# Patient Record
Sex: Female | Born: 1985 | Race: Black or African American | Hispanic: No | Marital: Single | Smoking: Current some day smoker
Health system: Southern US, Community
[De-identification: ages and names within clinical notes are randomized; demographics above are authoritative.]

## PROBLEM LIST (undated history)

## (undated) DIAGNOSIS — D573 Sickle-cell trait: Secondary | ICD-10-CM

## (undated) DIAGNOSIS — D696 Thrombocytopenia, unspecified: Secondary | ICD-10-CM

## (undated) DIAGNOSIS — D569 Thalassemia, unspecified: Secondary | ICD-10-CM

## (undated) DIAGNOSIS — N390 Urinary tract infection, site not specified: Secondary | ICD-10-CM

## (undated) HISTORY — PX: INDUCED ABORTION: SHX677

## (undated) HISTORY — DX: Thrombocytopenia, unspecified: D69.6

---
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
## 2004-06-07 ENCOUNTER — Emergency Department (HOSPITAL_COMMUNITY): Admission: EM | Admit: 2004-06-07 | Discharge: 2004-06-07 | Payer: Self-pay | Admitting: Emergency Medicine

## 2005-03-01 ENCOUNTER — Emergency Department (HOSPITAL_COMMUNITY): Admission: EM | Admit: 2005-03-01 | Discharge: 2005-03-01 | Payer: Self-pay | Admitting: Family Medicine

## 2006-06-04 ENCOUNTER — Emergency Department (HOSPITAL_COMMUNITY): Admission: EM | Admit: 2006-06-04 | Discharge: 2006-06-04 | Payer: Self-pay | Admitting: Emergency Medicine

## 2006-12-10 ENCOUNTER — Emergency Department (HOSPITAL_COMMUNITY): Admission: EM | Admit: 2006-12-10 | Discharge: 2006-12-10 | Payer: Self-pay | Admitting: Emergency Medicine

## 2008-02-25 ENCOUNTER — Emergency Department (HOSPITAL_COMMUNITY): Admission: EM | Admit: 2008-02-25 | Discharge: 2008-02-26 | Payer: Self-pay | Admitting: Emergency Medicine

## 2008-02-27 ENCOUNTER — Emergency Department (HOSPITAL_COMMUNITY): Admission: EM | Admit: 2008-02-27 | Discharge: 2008-02-28 | Payer: Self-pay | Admitting: Emergency Medicine

## 2008-02-28 ENCOUNTER — Inpatient Hospital Stay (HOSPITAL_COMMUNITY): Admission: AD | Admit: 2008-02-28 | Discharge: 2008-02-28 | Payer: Self-pay | Admitting: Obstetrics & Gynecology

## 2008-03-02 ENCOUNTER — Inpatient Hospital Stay (HOSPITAL_COMMUNITY): Admission: AD | Admit: 2008-03-02 | Discharge: 2008-03-02 | Payer: Self-pay | Admitting: Obstetrics & Gynecology

## 2008-05-01 ENCOUNTER — Encounter: Payer: Self-pay | Admitting: Family Medicine

## 2008-05-01 ENCOUNTER — Ambulatory Visit: Payer: Self-pay | Admitting: Family Medicine

## 2008-05-01 LAB — CONVERTED CEMR LAB
Antibody Screen: NEGATIVE
Basophils Absolute: 0 K/uL (ref 0.0–0.1)
Basophils Relative: 0 % (ref 0–1)
Eosinophils Absolute: 0.1 K/uL (ref 0.0–0.7)
Eosinophils Relative: 1 % (ref 0–5)
HCT: 27.7 % — ABNORMAL LOW (ref 36.0–46.0)
Hemoglobin: 10 g/dL — ABNORMAL LOW (ref 12.0–15.0)
Hepatitis B Surface Ag: NEGATIVE
Lymphocytes Relative: 27 % (ref 12–46)
Lymphs Abs: 2.4 K/uL (ref 0.7–4.0)
MCHC: 36.1 g/dL — ABNORMAL HIGH (ref 30.0–36.0)
MCV: 65.6 fL — ABNORMAL LOW (ref 78.0–100.0)
Monocytes Absolute: 0.8 K/uL (ref 0.1–1.0)
Monocytes Relative: 10 % (ref 3–12)
Neutro Abs: 5.3 K/uL (ref 1.7–7.7)
Neutrophils Relative %: 62 % (ref 43–77)
Platelets: 131 K/uL — ABNORMAL LOW (ref 150–400)
RBC: 4.22 M/uL (ref 3.87–5.11)
RDW: 19 % — ABNORMAL HIGH (ref 11.5–15.5)
Rh Type: POSITIVE
Rubella: 25.5 [IU]/mL — ABNORMAL HIGH
Sickle Cell Screen: NEGATIVE
WBC: 8.6 10*3/microliter (ref 4.0–10.5)

## 2008-05-04 ENCOUNTER — Encounter: Payer: Self-pay | Admitting: Family Medicine

## 2008-05-04 ENCOUNTER — Ambulatory Visit: Payer: Self-pay | Admitting: Family Medicine

## 2008-05-04 ENCOUNTER — Other Ambulatory Visit: Admission: RE | Admit: 2008-05-04 | Discharge: 2008-05-04 | Payer: Self-pay | Admitting: Family Medicine

## 2008-05-04 DIAGNOSIS — E739 Lactose intolerance, unspecified: Secondary | ICD-10-CM

## 2008-05-04 LAB — CONVERTED CEMR LAB: GC Probe Amp, Genital: NEGATIVE

## 2008-05-06 ENCOUNTER — Encounter: Payer: Self-pay | Admitting: Family Medicine

## 2008-05-06 ENCOUNTER — Ambulatory Visit (HOSPITAL_COMMUNITY): Admission: RE | Admit: 2008-05-06 | Discharge: 2008-05-06 | Payer: Self-pay | Admitting: Family Medicine

## 2008-05-07 ENCOUNTER — Ambulatory Visit: Payer: Self-pay | Admitting: Family Medicine

## 2008-05-07 ENCOUNTER — Encounter: Payer: Self-pay | Admitting: Family Medicine

## 2008-05-08 ENCOUNTER — Encounter: Payer: Self-pay | Admitting: Family Medicine

## 2008-05-13 LAB — CONVERTED CEMR LAB
Pap Smear: NORMAL
Pap Smear: NORMAL

## 2008-05-19 ENCOUNTER — Ambulatory Visit: Payer: Self-pay | Admitting: Family Medicine

## 2008-05-19 ENCOUNTER — Encounter: Payer: Self-pay | Admitting: Family Medicine

## 2008-05-19 LAB — CONVERTED CEMR LAB
HCT: 26.4 % — ABNORMAL LOW (ref 36.0–46.0)
Hemoglobin: 9.7 g/dL — ABNORMAL LOW (ref 12.0–15.0)
Hgb A2 Quant: 4.1 % — ABNORMAL HIGH (ref 2.2–3.2)
Hgb A: 19.8 % — ABNORMAL LOW (ref 96.8–97.8)
Hgb F Quant: 15.8 % — ABNORMAL HIGH (ref 0.0–2.0)
Hgb S Quant: 0 % (ref 0.0–0.0)
Platelets: 113 10*3/uL — ABNORMAL LOW (ref 150–400)
RDW: 19.4 % — ABNORMAL HIGH (ref 11.5–15.5)

## 2008-05-22 ENCOUNTER — Telehealth: Payer: Self-pay | Admitting: Family Medicine

## 2008-06-05 ENCOUNTER — Encounter: Payer: Self-pay | Admitting: Family Medicine

## 2008-06-05 ENCOUNTER — Ambulatory Visit: Payer: Self-pay | Admitting: Family Medicine

## 2008-06-05 DIAGNOSIS — G5600 Carpal tunnel syndrome, unspecified upper limb: Secondary | ICD-10-CM

## 2008-06-05 DIAGNOSIS — O99019 Anemia complicating pregnancy, unspecified trimester: Secondary | ICD-10-CM

## 2008-06-17 ENCOUNTER — Ambulatory Visit: Payer: Self-pay | Admitting: Family Medicine

## 2008-06-29 ENCOUNTER — Encounter: Payer: Self-pay | Admitting: Family Medicine

## 2008-06-29 ENCOUNTER — Ambulatory Visit: Payer: Self-pay | Admitting: Family Medicine

## 2008-07-08 ENCOUNTER — Encounter: Payer: Self-pay | Admitting: Family Medicine

## 2008-07-08 ENCOUNTER — Ambulatory Visit: Payer: Self-pay | Admitting: Family Medicine

## 2008-07-14 ENCOUNTER — Inpatient Hospital Stay (HOSPITAL_COMMUNITY): Admission: AD | Admit: 2008-07-14 | Discharge: 2008-07-14 | Payer: Self-pay | Admitting: Obstetrics & Gynecology

## 2008-07-15 ENCOUNTER — Ambulatory Visit: Payer: Self-pay | Admitting: Family Medicine

## 2008-07-15 ENCOUNTER — Inpatient Hospital Stay (HOSPITAL_COMMUNITY): Admission: AD | Admit: 2008-07-15 | Discharge: 2008-07-17 | Payer: Self-pay | Admitting: Obstetrics & Gynecology

## 2008-07-20 ENCOUNTER — Telehealth: Payer: Self-pay | Admitting: Family Medicine

## 2008-07-21 ENCOUNTER — Ambulatory Visit: Payer: Self-pay | Admitting: Family Medicine

## 2008-07-21 ENCOUNTER — Encounter: Payer: Self-pay | Admitting: Sports Medicine

## 2008-07-21 LAB — CONVERTED CEMR LAB
Basophils Absolute: 0 10*3/uL (ref 0.0–0.1)
Basophils Relative: 0 % (ref 0–1)
Eosinophils Absolute: 0.2 10*3/uL (ref 0.0–0.7)
Eosinophils Relative: 2 % (ref 0–5)
HCT: 32.5 % — ABNORMAL LOW (ref 36.0–46.0)
Hemoglobin: 10.4 g/dL — ABNORMAL LOW (ref 12.0–15.0)
Lymphocytes Relative: 30 % (ref 12–46)
Lymphs Abs: 2.3 K/uL (ref 0.7–4.0)
MCHC: 32 g/dL (ref 30.0–36.0)
MCV: 75.6 fL — ABNORMAL LOW (ref 78.0–100.0)
Monocytes Absolute: 0.7 10*3/uL (ref 0.1–1.0)
Monocytes Relative: 9 % (ref 3–12)
Neutro Abs: 4.5 10*3/uL (ref 1.7–7.7)
Neutrophils Relative %: 59 % (ref 43–77)
Platelets: 190 K/uL (ref 150–400)
RBC: 4.3 M/uL (ref 3.87–5.11)
RDW: 19.6 % — ABNORMAL HIGH (ref 11.5–15.5)
WBC: 7.6 10*3/microliter (ref 4.0–10.5)

## 2009-01-25 ENCOUNTER — Emergency Department (HOSPITAL_COMMUNITY): Admission: EM | Admit: 2009-01-25 | Discharge: 2009-01-26 | Payer: Self-pay | Admitting: Emergency Medicine

## 2009-02-15 ENCOUNTER — Emergency Department (HOSPITAL_COMMUNITY): Admission: EM | Admit: 2009-02-15 | Discharge: 2009-02-15 | Payer: Self-pay | Admitting: Emergency Medicine

## 2009-06-14 ENCOUNTER — Encounter: Payer: Self-pay | Admitting: Family Medicine

## 2010-02-01 NOTE — Progress Notes (Signed)
Summary: triage  Phone Note Call from Patient Call back at [phone]   Caller: Patient Summary of Call: pt had baby last week had epidural and is still having real bad back pain. Initial call taken by: Clydell Hakim,  July 20, 2008 3:00 PM  Follow-up for Phone Call        crying. states the pain comes in "spurts"  states she is crying for reasons other than her back. taking ibuprofen that does help a little. did not want to go to [facility] or Urgent care. appt in am 8:30  (had a baby boy)  told her to go if the pain got worse Follow-up by: Golden Circle RN,  July 20, 2008 3:27 PM

## 2010-02-01 NOTE — Miscellaneous (Signed)
  Clinical Lists Changes  Problems: Removed problem of NECK AND BACK PAIN (ICD-723.1) Removed problem of PRENATAL CARE, DELAYED (ICD-V23.7) Removed problem of SUPERVISION OF OTHER NORMAL PREGNANCY (ICD-V22.1) Medications: Removed medication of PERCOCET 5-325 MG TABS (OXYCODONE-ACETAMINOPHEN) One to Two tabs by mouth q4-6h as needed for back pain Removed medication of PRENATAL VITAMINS 0.8 MG TABS (PRENATAL MULTIVIT-MIN-FE-FA) one by mouth daily Removed medication of FE TABS 325 (65 FE) MG TBEC (FERROUS SULFATE) one by mouth two times a day Removed medication of COLACE 100 MG CAPS (DOCUSATE SODIUM) one by mouth two times a day as needed for constipation Observations: Added new observation of PAST MED HX: sickle cell trait per 2005 delivery records NSVD x 2 pap 5/10 normal (06/14/2009 12:15) Added new observation of PRIMARY MD: Myrtie Soman  MD (06/14/2009 12:15)       Past History:  Past Medical History: sickle cell trait per 2005 delivery records NSVD x 2 pap 5/10 normal

## 2010-02-01 NOTE — Assessment & Plan Note (Signed)
 Summary: PAIN FROM EPIDURAL/KH   Vital Signs:  Patient profile:   25 year old female Weight:      144 pounds Temp:     98.7 degrees F Pulse rate:   75 / minute BP sitting:   142 / 89  (left arm)  Vitals Entered By: Jack Bloodgood CMA, (July 21, 2008 9:22 AM) CC: pain.SABRAafter epidural from neck to lower back Is Patient Diabetic? No Pain Assessment Patient in pain? yes     Location: neck/back Intensity: 4 Onset of pain  x 1week   Primary Care Provider:  Jerel Prescott  MD  CC:  pain.SABRAafter epidural from neck to lower back.  History of Present Illness: 5F 6 days postpartum.  Had epidural anesthesia.  Now with c/o neck and back pain since DC.  Had lots of LBP during gestation.  Now with pain that is described as spasm present from mid cervical region down to sacrum.  No HA, no numbness/tingling/weakness in extremeties, no loss of bowel or bladder, ambulatory, No fevers/chills/N/V/D/C.  Pain does not change with position but does worsen with movement.    Otherwise doing well post-partum.  Habits & Providers  Alcohol-Tobacco-Diet     Tobacco Status: quit  Past History:  Past Medical History: Last updated: 05/19/2008 sickle cell trait per 2005 delivery records NSVD x 1 pap 5/10 normal  Past Surgical History: Last updated: 05/04/2008 none  Family History: Last updated: 05/04/2008 diabetes in mom and diabetes HTN in mom and maternal grandmother  Social History: Last updated: 05/04/2008 Lives with 10 year-old son. Works as LAWYER at [employer]. Non-smoker, no alcohol, no drug use.   Social History: Smoking Status:  quit  Review of Systems       See HPI  Physical Exam  General:  Well-developed,well-nourished,in no acute distress; alert,appropriate and cooperative throughout examination Head:  Normocephalic and atraumatic without obvious abnormalities. No apparent alopecia or balding. Eyes:  No corneal or conjunctival inflammation noted. EOMI. Perrla.    Ears:  External ear exam shows no significant lesions or deformities.  Nose:  External nasal examination shows no deformity or inflammation Mouth:  Oral mucosa and oropharynx without lesions or exudates. Neck:  No deformities, masses noted. Lungs:  Normal respiratory effort, chest expands symmetrically. Lungs are clear to auscultation, no crackles or wheezes. Heart:  Normal rate and regular rhythm. S1 and S2 normal without gallop, murmur, click, rub or other extra sounds. Abdomen:  Bowel sounds positive,abdomen soft and non-tender without masses, organomegaly or hernias noted. Msk:  TTP paraspinal from mid cervical down to mid sacral.  Paraspinal muscles both firm.  TTP over SI joints.  Pain reproduced with flexion of hips and flattening of lumbar lordosis.  FABER and FADIR tests negative.  straight leg raise does not produce sciatic-type paresthesias, only LBP.  No swelling, erythema, drainage from well healed spinal anesthesia site.   Extremities:  No clubbing, cyanosis, edema, or deformity noted with normal full range of motion of all joints.  Ambulatory with normal gait. Neurologic:  Strength 5/5 in bilateral upper and lower ext.  Sensation intact.  DTRs 2+ in achilles and patellar ligaments.  No ankle clonus.  Trendelenburg sign negative.   Impression & Recommendations:  Problem # 1:  NECK AND BACK PAIN (ICD-723.1) Assessment New Post-spinal anesthesia back pain can last up to 14 days after LP and is not associated with positional changes like post-LP headaches are.  Things to rule out are epidural CSF leak, hematoma, and infection.  No fevers/chills/swelling  in the area, unlikely infection. Will check CBC with diff to make sure.  If no improvement by day 14 then will consider imaging to r/o CSF leak or hematoma.  Percocet for pain for now.  RTC if ay warning symptoms, increasing pain, numbness/tingling, bowel/bladder incontinence, fevers/chills.  RTC in 8 days to reassess symptoms.  Her  updated medication list for this problem includes:    Percocet 5-325 Mg Tabs (Oxycodone -acetaminophen ) ..... One to two tabs by mouth q4-6h as needed for back pain  Orders: CBC w/Diff-[facility] (14974) [facility]- Est Level  3 (00786)  Complete Medication List: 1)  Prenatal Vitamins 0.8 Mg Tabs (Prenatal multivit-min-fe-fa) .... One by mouth daily 2)  Fe Tabs 325 (65 Fe) Mg Tbec (Ferrous sulfate ) .... One by mouth two times a day 3)  Colace 100 Mg Caps (Docusate sodium ) .... One by mouth two times a day as needed for constipation 4)  Percocet 5-325 Mg Tabs (Oxycodone -acetaminophen ) .... One to two tabs by mouth q4-6h as needed for back pain  Patient Instructions: 1)  Good to meet you today, 2)  I want you to go to the lab and have your blood checked, then take the percocet for the back pain.  Come back to see me in 8 days to reassess your pain. 3)  If you start to have numbness, tingling, weakness, fevers/chills, bowel/bladder incontinence, swelling/drainage from your lower back then come back to the office immediately. 4)  -Dr. ONEIDA. Prescriptions: PERCOCET 5-325 MG TABS (OXYCODONE -ACETAMINOPHEN ) One to Two tabs by mouth q4-6h as needed for back pain  #30 x 0   Entered and Authorized by:   Debby Petties MD   Signed by:   Debby Petties MD on 07/21/2008   Method used:   Handwritten   RxID:   [id number]

## 2010-03-29 ENCOUNTER — Emergency Department (HOSPITAL_COMMUNITY)
Admission: EM | Admit: 2010-03-29 | Discharge: 2010-03-29 | Disposition: A | Discharge status: Home / Self Care | Payer: Self-pay | Attending: Emergency Medicine | Admitting: Emergency Medicine

## 2010-03-29 ENCOUNTER — Emergency Department (HOSPITAL_COMMUNITY): Payer: Self-pay

## 2010-03-29 DIAGNOSIS — M940 Chondrocostal junction syndrome [Tietze]: Secondary | ICD-10-CM | POA: Insufficient documentation

## 2010-03-29 DIAGNOSIS — R071 Chest pain on breathing: Secondary | ICD-10-CM | POA: Insufficient documentation

## 2010-03-29 DIAGNOSIS — R059 Cough, unspecified: Secondary | ICD-10-CM | POA: Insufficient documentation

## 2010-03-29 DIAGNOSIS — R6883 Chills (without fever): Secondary | ICD-10-CM | POA: Insufficient documentation

## 2010-03-29 DIAGNOSIS — R05 Cough: Secondary | ICD-10-CM | POA: Insufficient documentation

## 2010-03-29 DIAGNOSIS — J069 Acute upper respiratory infection, unspecified: Secondary | ICD-10-CM | POA: Insufficient documentation

## 2010-03-29 LAB — URINALYSIS, ROUTINE W REFLEX MICROSCOPIC
Glucose, UA: NEGATIVE mg/dL
Hgb urine dipstick: NEGATIVE
Ketones, ur: NEGATIVE mg/dL
Nitrite: NEGATIVE
Specific Gravity, Urine: 1.017 (ref 1.005–1.030)
pH: 6 (ref 5.0–8.0)

## 2010-04-10 LAB — CBC
MCHC: 33.7 g/dL (ref 30.0–36.0)
Platelets: 139 10*3/uL — ABNORMAL LOW (ref 150–400)
RDW: 18.5 % — ABNORMAL HIGH (ref 11.5–15.5)
WBC: 15.2 10*3/uL — ABNORMAL HIGH (ref 4.0–10.5)

## 2010-04-10 LAB — RAPID URINE DRUG SCREEN, HOSP PERFORMED
Amphetamines: NOT DETECTED
Barbiturates: NOT DETECTED
Cocaine: NOT DETECTED
Opiates: NOT DETECTED

## 2010-04-12 LAB — GLUCOSE, CAPILLARY
Glucose-Capillary: 151 mg/dL — ABNORMAL HIGH (ref 70–99)
Glucose-Capillary: 91 mg/dL (ref 70–99)

## 2010-04-19 LAB — URINALYSIS, ROUTINE W REFLEX MICROSCOPIC
Glucose, UA: NEGATIVE mg/dL
Ketones, ur: 15 mg/dL — AB
Nitrite: NEGATIVE
Protein, ur: 30 mg/dL — AB
Specific Gravity, Urine: 1.02 (ref 1.005–1.030)
Urobilinogen, UA: 2 mg/dL — ABNORMAL HIGH (ref 0.0–1.0)
Urobilinogen, UA: 2 mg/dL — ABNORMAL HIGH (ref 0.0–1.0)
pH: 6.5 (ref 5.0–8.0)

## 2010-04-19 LAB — DIFFERENTIAL
Basophils Absolute: 0 10*3/uL (ref 0.0–0.1)
Eosinophils Relative: 0 % (ref 0–5)
Lymphocytes Relative: 13 % (ref 12–46)
Lymphs Abs: 1.5 10*3/uL (ref 0.7–4.0)
Monocytes Absolute: 0.8 10*3/uL (ref 0.1–1.0)
Monocytes Relative: 7 % (ref 3–12)
Neutro Abs: 9.3 10*3/uL — ABNORMAL HIGH (ref 1.7–7.7)

## 2010-04-19 LAB — CBC
HCT: 26.7 % — ABNORMAL LOW (ref 36.0–46.0)
Hemoglobin: 9 g/dL — ABNORMAL LOW (ref 12.0–15.0)
RBC: 3.69 MIL/uL — ABNORMAL LOW (ref 3.87–5.11)
RDW: 19 % — ABNORMAL HIGH (ref 11.5–15.5)
WBC: 11.6 10*3/uL — ABNORMAL HIGH (ref 4.0–10.5)

## 2010-04-19 LAB — POCT I-STAT, CHEM 8
BUN: 5 mg/dL — ABNORMAL LOW (ref 6–23)
Calcium, Ion: 1.14 mmol/L (ref 1.12–1.32)
Chloride: 106 mEq/L (ref 96–112)
Creatinine, Ser: 0.6 mg/dL (ref 0.4–1.2)

## 2010-04-19 LAB — URINE CULTURE: Culture: NO GROWTH

## 2010-04-19 LAB — URINE MICROSCOPIC-ADD ON

## 2010-04-19 LAB — ABO/RH: ABO/RH(D): A POS

## 2010-04-19 LAB — GC/CHLAMYDIA PROBE AMP, GENITAL: GC Probe Amp, Genital: NEGATIVE

## 2010-04-19 LAB — WET PREP, GENITAL: Trich, Wet Prep: NONE SEEN

## 2010-04-19 LAB — CULTURE, ROUTINE-GENITAL

## 2010-05-20 IMAGING — US US OB LIMITED
1 series · 13 of 13 positions shown · non-contrast
Comparison: none

CLINICAL DATA: OBSTETRIC 14+ WK ULTRASOUND LIMITED

[Series 1: us ob limited · 0.24mm/px · 13 of 13 slices shown]
[im 1/13]
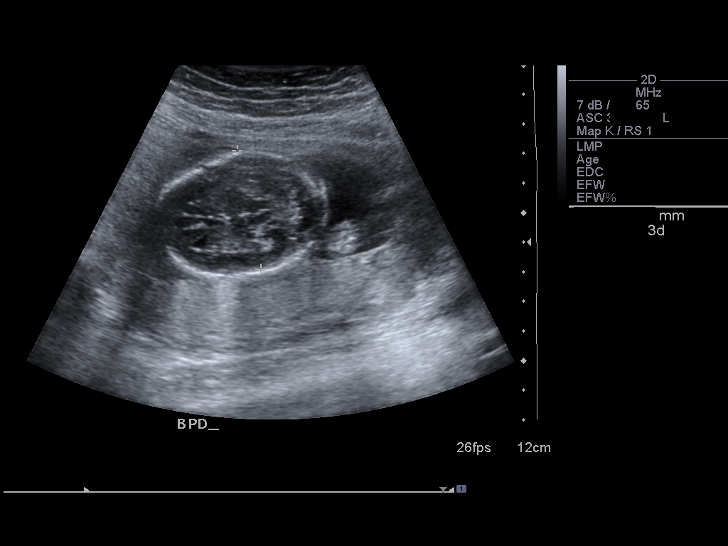
[im 2/13]
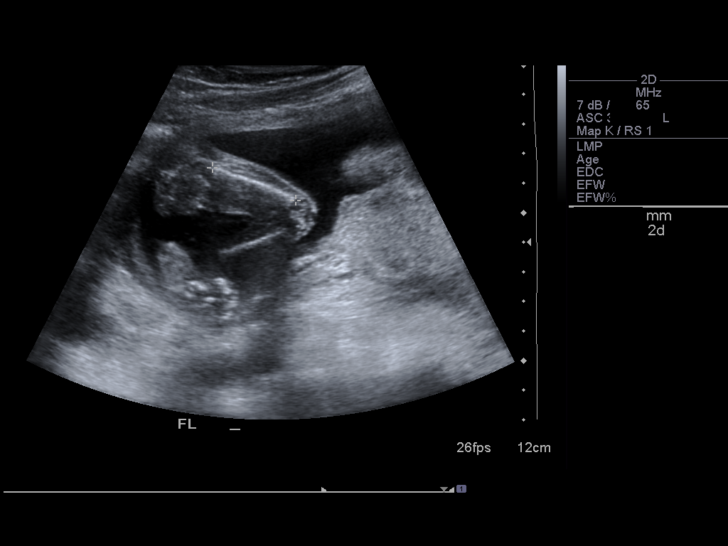
[im 3/13]
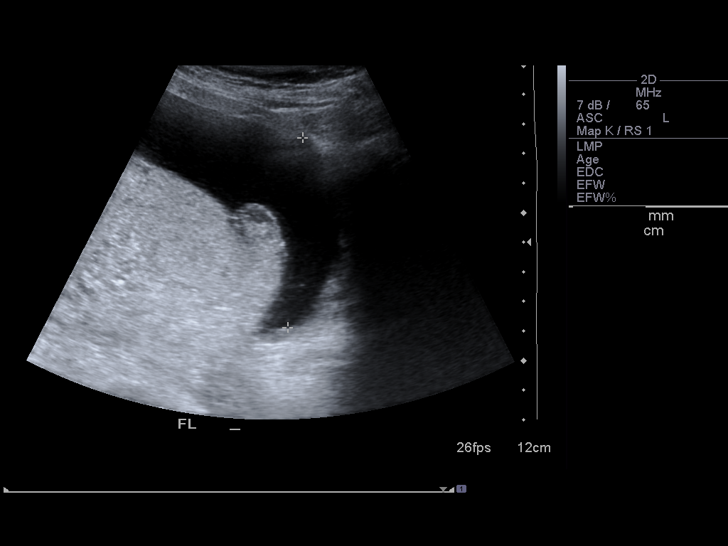
[im 4/13]
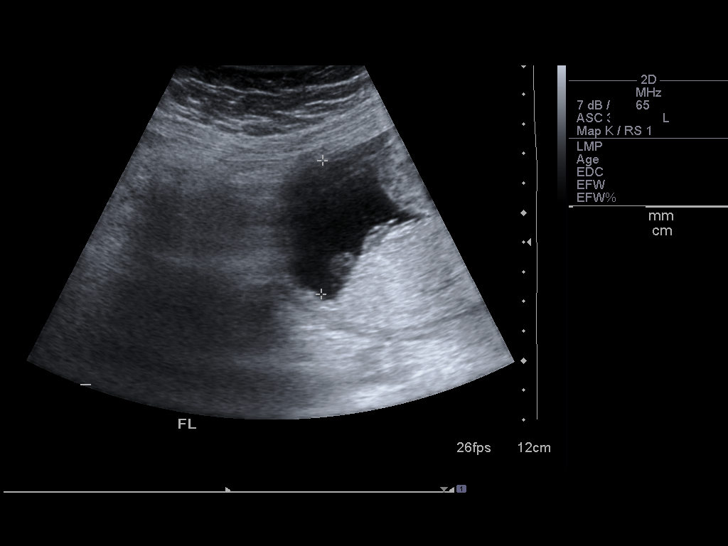
[im 5/13]
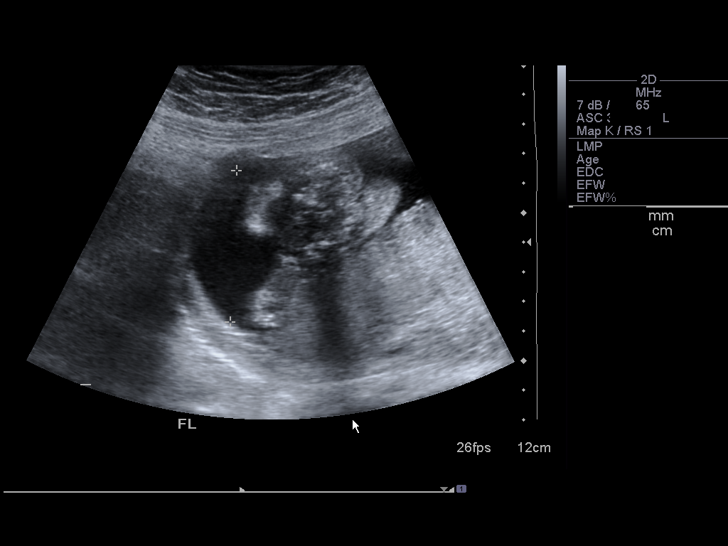
[im 6/13]
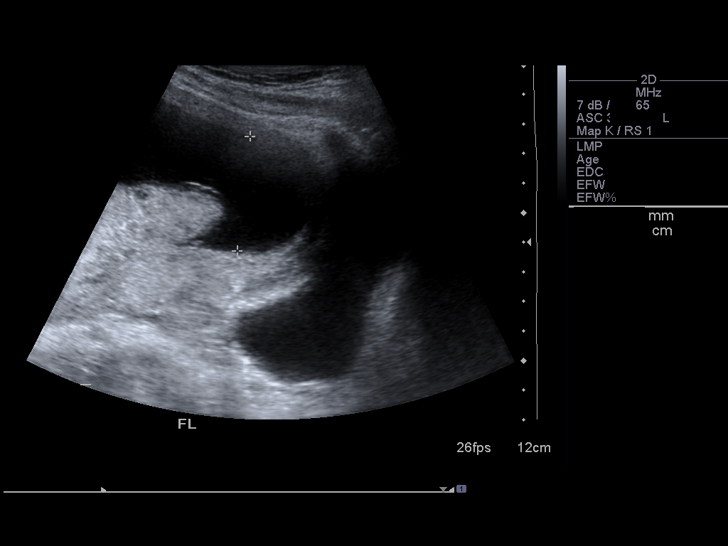
[im 7/13]
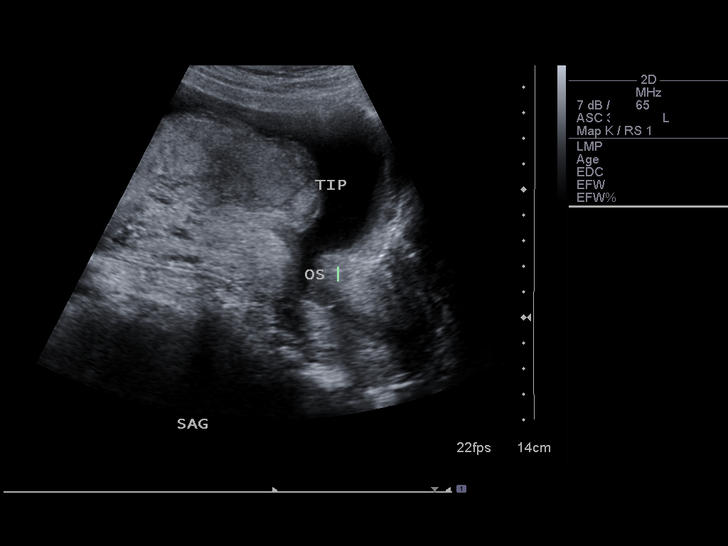
[im 8/13]
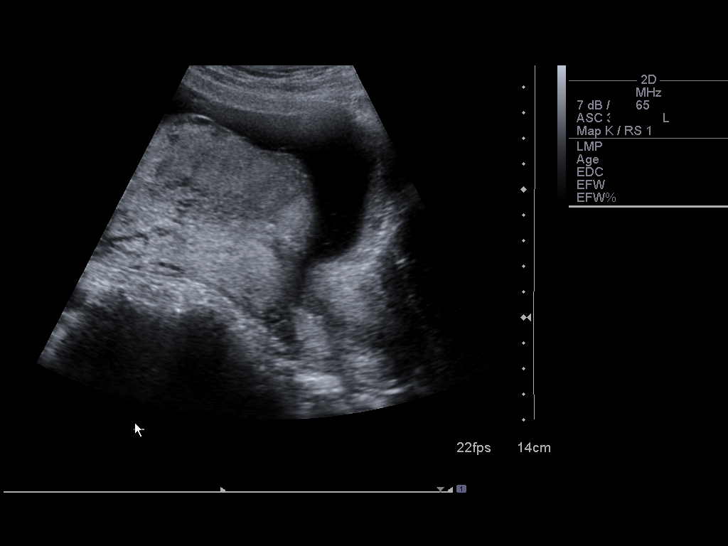
[im 9/13]
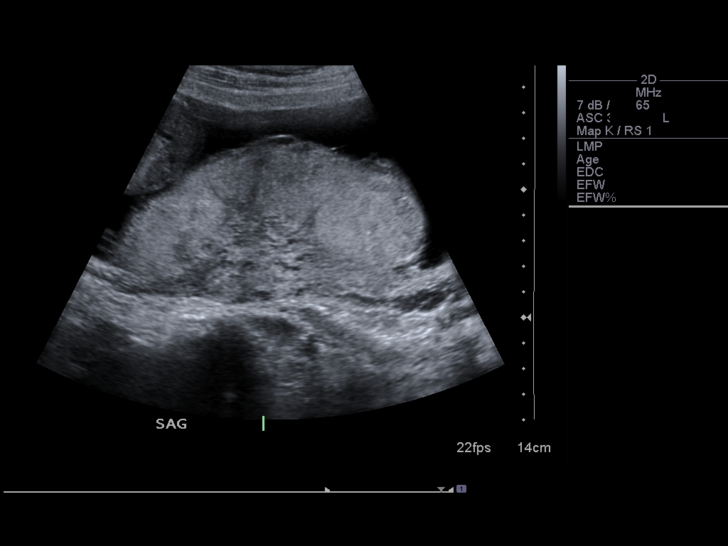
[im 10/13]
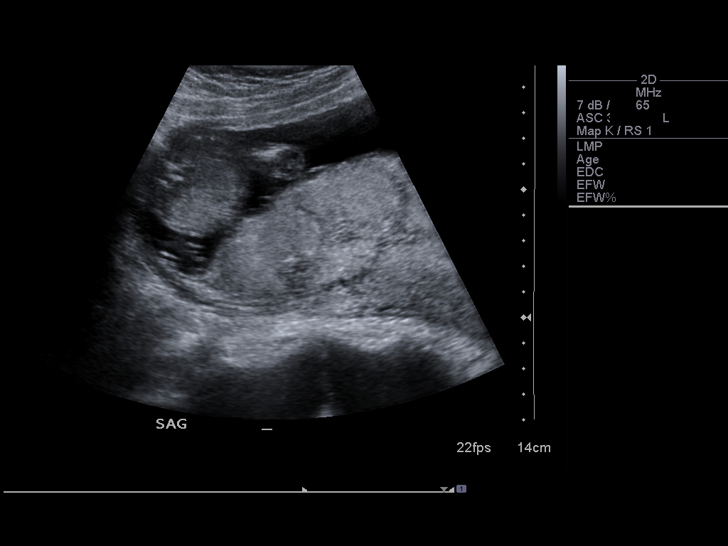
[im 11/13]
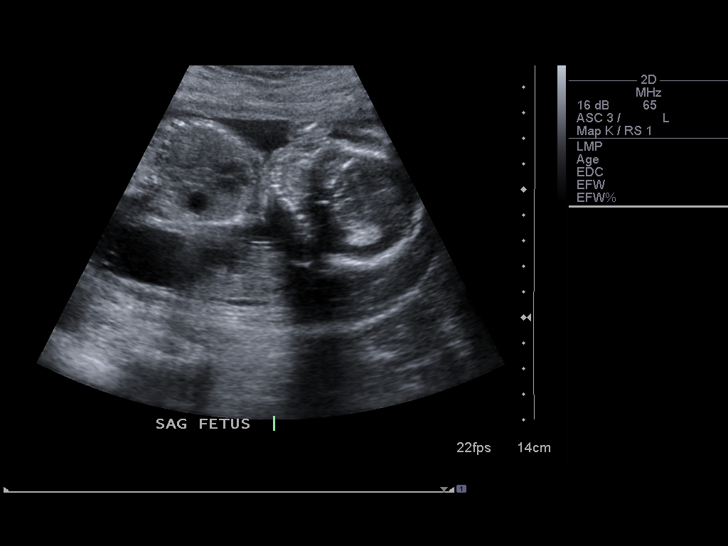
[im 12/13]
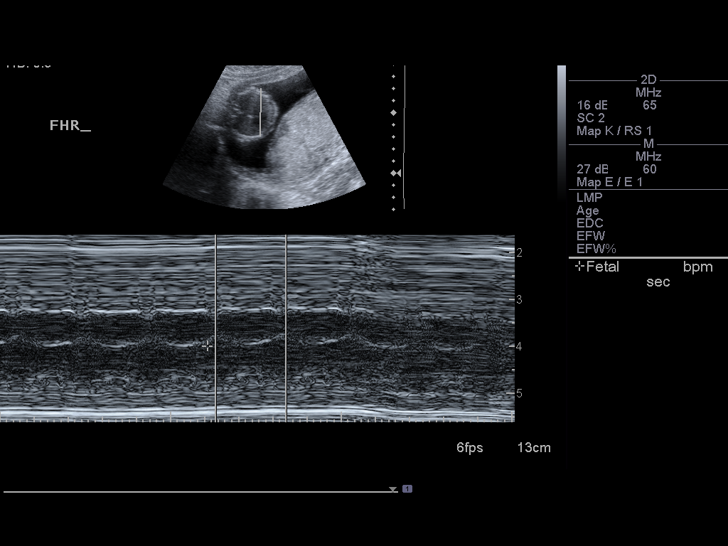
[im 13/13]
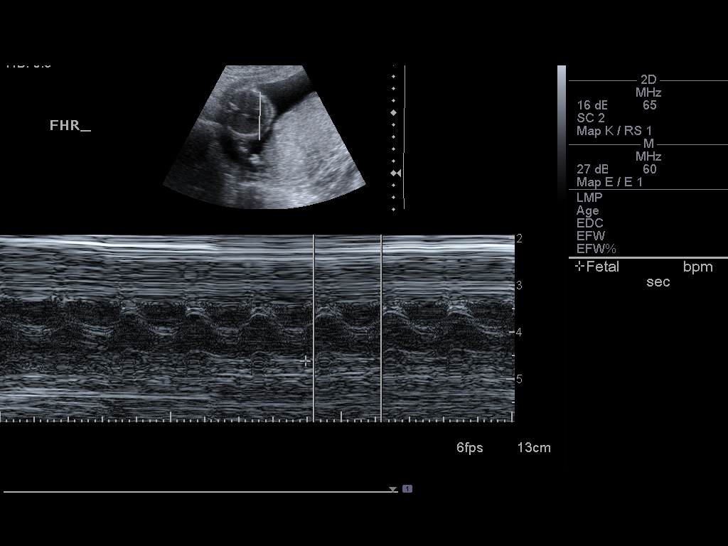

[13 of 13 positions shown; findings below may reference images not displayed]

FINDINGS: Single intrauterine fetus in transverse position with normal fetal
movement.  The biparietal diameter equals 4.1 cm for a calculated
gestational age of 8 weeks 3 days.

The placenta is posterior and low. No evidence of abruption or
subchorionic hemorrhage.  No evidence of previa.  The cervix is
closed.
IMPRESSION: 1.  Normal fetus on limited examination.
2.  No evidence of placenta previa.

## 2010-07-11 ENCOUNTER — Inpatient Hospital Stay (HOSPITAL_COMMUNITY)
Admission: AD | Admit: 2010-07-11 | Discharge: 2010-07-11 | Disposition: A | Discharge status: Home / Self Care | Payer: Medicaid Other | Source: Ambulatory Visit | Attending: Obstetrics & Gynecology | Admitting: Obstetrics & Gynecology

## 2010-07-11 ENCOUNTER — Encounter (HOSPITAL_COMMUNITY): Payer: Self-pay | Admitting: *Deleted

## 2010-07-11 ENCOUNTER — Inpatient Hospital Stay (HOSPITAL_COMMUNITY): Admission: AD | Admit: 2010-07-11 | Payer: Self-pay | Source: Ambulatory Visit | Admitting: Obstetrics & Gynecology

## 2010-07-11 DIAGNOSIS — O210 Mild hyperemesis gravidarum: Secondary | ICD-10-CM | POA: Insufficient documentation

## 2010-07-11 DIAGNOSIS — R112 Nausea with vomiting, unspecified: Secondary | ICD-10-CM

## 2010-07-11 DIAGNOSIS — Z3490 Encounter for supervision of normal pregnancy, unspecified, unspecified trimester: Secondary | ICD-10-CM

## 2010-07-11 DIAGNOSIS — D573 Sickle-cell trait: Secondary | ICD-10-CM | POA: Insufficient documentation

## 2010-07-11 HISTORY — DX: Sickle-cell trait: D57.3

## 2010-07-11 LAB — URINALYSIS, ROUTINE W REFLEX MICROSCOPIC
Glucose, UA: NEGATIVE mg/dL
Hgb urine dipstick: NEGATIVE
Leukocytes, UA: NEGATIVE
pH: 7 (ref 5.0–8.0)

## 2010-07-11 LAB — POCT PREGNANCY, URINE: Preg Test, Ur: POSITIVE

## 2010-07-11 MED ORDER — PROMETHAZINE HCL 25 MG PO TABS: 25.0000 mg | ORAL_TABLET | Freq: Four times a day (QID) | ORAL | Status: AC | PRN

## 2010-07-11 NOTE — ED Notes (Signed)
E. Rice confirmed IUP w/cardiac activity.

## 2010-07-11 NOTE — ED Provider Notes (Signed)
History   Pt presents today c/o severe N&V. She states she has been unable to keep anything on her stomach. However, pt is eating a bag of cheetos on initial interview. She denies lower abd pain, vag dc, or bleeding.  Chief Complaint  Patient presents with  . Nausea   HPI  OB History    Grav Para Term Preterm Abortions TAB SAB Ect Mult Living   5 2 2  2 2    2       Past Medical History  Diagnosis Date  . Sickle cell trait     History reviewed. No pertinent past surgical history.  No family history on file.  History  Substance Use Topics  . Smoking status: Current Some Day Smoker  . Smokeless tobacco: Not on file  . Alcohol Use: 0.6 oz/week    1 Shots of liquor per week    Allergies: No Known Allergies  Prescriptions prior to admission  Medication Sig Dispense Refill  . acetaminophen (TYLENOL) 325 MG tablet Take 650 mg by mouth daily. Patient takes as needed for pain.       . Prenatal Vit-Fe Fumarate-FA (PRENATAL PLUS PO) Take 1 tablet by mouth daily.          Review of Systems  Constitutional: Negative for fever and chills.  Cardiovascular: Negative for chest pain.  Gastrointestinal: Positive for nausea and vomiting. Negative for heartburn.  Genitourinary: Negative for dysuria, urgency, frequency and hematuria.  Neurological: Negative for dizziness and headaches.  Psychiatric/Behavioral: Negative for depression and suicidal ideas.   Physical Exam   Blood pressure 124/83, pulse 90, temperature 98.5 F (36.9 C), temperature source Oral, resp. rate 16, height 5\' 5"  (1.651 m), weight 150 lb 9.6 oz (68.312 kg).  Physical Exam  Nursing note and vitals reviewed. Constitutional: She is oriented to person, place, and time. She appears well-developed and well-nourished. No distress.  HENT:  Head: Normocephalic and atraumatic.  Eyes: Conjunctivae and EOM are normal. Pupils are equal, round, and reactive to light.  Cardiovascular: Normal rate, regular rhythm and normal  heart sounds.   No murmur heard. Respiratory: Effort normal and breath sounds normal. No respiratory distress. She has no wheezes. She has no rales. She exhibits no tenderness.  GI: Soft. Bowel sounds are normal. She exhibits no distension. There is no tenderness. There is no rebound and no guarding.  Neurological: She is alert and oriented to person, place, and time.  Skin: Skin is warm and dry. She is not diaphoretic.  Psychiatric: She has a normal mood and affect. Her behavior is normal. Judgment and thought content normal.    MAU Course  Procedures  Bedside US was performed which demonstrated a single IUP with cardiac activity. Measurements were not taken.  A/P: 1) N&V: pt is stable. No signs of dehydration. Will give Rx for phenergan. She is to begin prenatal care ASAP. Discussed diet, activity, risks, and precautions.   Henrietta Hoover, [place] 07/11/10 1658

## 2010-07-11 NOTE — Initial Assessments (Signed)
No nausea. No pain.

## 2010-07-11 NOTE — Discharge Instructions (Signed)
ABC's of Pregnancy A Antepartum care is very important. Be sure you see your doctor and get prenatal care as soon as you think you are pregnant. At this time, you will be tested for infection, genetic abnormalities and potential problems with you and the pregnancy. This is the time to discuss diet, exercise, work, medications, labor, pain medication during labor and the possibility of a Cesarean delivery. Ask any questions that may concern you. It is important to see your doctor regularly throughout your pregnancy. Avoid exposure to toxic substances and chemicals - such as cleaning solvents, lead and mercury, some insecticides, and paint. Pregnant women should avoid exposure to paint fumes, and fumes that cause you to feel ill, dizzy or faint. When possible, it is a good idea to have a pre-pregnancy consultation with your caregiver to begin some important recommendations your caregiver suggests such as, taking folic acid, exercising, quitting smoking, avoiding alcoholic beverages, etc. B Breastfeeding is the healthiest choice for both you and your baby. It has many nutritional benefits for the baby and health benefits for the mother. It also creates a very tight and loving bond between the baby and mother. Talk to your doctor, your family and friends, and your employer about how you choose to feed your baby and how they can support you in your decision. Not all Birth defects can be prevented, but a woman can take actions that may increase her chance of having a healthy baby. Many birth defects happen very early in pregnancy, sometimes before a woman even knows she is pregnant. Birth defects or abnormalities of any child in your or the father's family should be discussed with your caregiver. Get a good support Bra as your Breast size changes. Wear it especially when you exercise and when nursing.  C Celebrate the news of your pregnancy with the your spouse/father and family. Childbirth classes are helpful to take  for you and the spouse/father because it helps to understand what happens during the pregnancy, labor and delivery. Cesarean delivery should be discussed with your doctor so you are prepared for that possibility. The pros and cons of Circumcision if it is a boy, should be discussed with your pediatrician. Cigarette smoking during pregnancy can result in low birth weight babies. It has been associated with infertility, miscarriages, tubal pregnancies, infant death (mortality) and poor health (morbidity) in childhood. Additionally, cigarette smoking may cause long-term learning disabilities. If you smoke, you should try to quit before getting pregnant and not smoke during the pregnancy. Secondary smoke may also harm a mother and her developing baby. It is a good idea to ask people to stop smoking around you during your pregnancy and after the baby is born. Extra Calcium is necessary when you are pregnant and is found in your prenatal vitamin, in dairy products, green leafy vegetables and in calcium supplements. D A healthy Diet according to your current weight and height, along with vitamins and mineral supplements should be discussed with your caregiver. Domestic abuse and/or violence should be made known to your doctor right away to get the situation corrected. Drink more water when you exercise to keep hydrated. Discomfort of your back and legs usually develops and progresses from the middle of the second trimester through to delivery of the baby. This is because of the enlarging baby and uterus, which may also affect your balance. Do not take illegal drugs. Illegal drugs can seriously harm the baby and you. Drink extra fluids (water is best) throughout pregnancy to help  your body keep up with the increases in your blood volume. Drink at least 6 to 8 glasses of water, fruit juice, or milk each day. A good way to know you are drinking enough fluid is when your urine looks almost like clear water or is very light  yellow.  E Eat healthy to get the nutrients you and your unborn baby need. Your meals should include the five basic food groups. Exercise (30 minutes of light to moderate exercise a day) is important and encouraged during pregnancy, if there are no medical problems or problems with the pregnancy. Exercise that causes discomfort or dizziness should be stopped and reported to your caregiver. Emotions during pregnancy can change from being ecstatic to depression and should be understood by you, your partner and your family. F Fetal screening with ultrasound, amniocentesis and monitoring during pregnancy and labor is common and sometimes necessary. Take 400 micrograms of Folic acid daily both before, when possible, and during the first few months of pregnancy to reduce the risk of birth defects of the brain and spine. All women who could possibly become pregnant should take a vitamin with folic acid, every day. It is also important to eat a healthy diet with fortified foods (enriched grain products, including cereals, Aliana Kreischer, breads, and pastas) and foods with natural sources of folate (orange juice, green leafy vegetables, beans, peanuts, broccoli, asparagus, peas, and lentils). The Father should be involved with all aspects of the pregnancy including, the prenatal care, childbirth classes, labor, delivery and postpartum time. Fathers may also have emotional concerns about being a father, financial needs and raising a family. G Genetic testing should be done appropriately. It is important to know your family and the father's history. If there have been problems with pregnancies or birth defects in your family, report these to your doctor. Also, genetic counselors can talk with you about the information you might need in making decisions about having a family. You can call a major medical center in your area for help in finding a board-certified genetic counselor. Genetic testing and counseling should be done before  pregnancy when possible, especially if there is a history of problems in the mother's or father's family. Certain ethnic backgrounds are more at risk for genetic defects. H Get familiar with the Hospital where you will be having your baby. Get to know how long it takes to get there, the labor and delivery area, and the hospital procedures. Be sure your medical insurance is accepted there. Get your Home ready for the baby including, clothes, the baby's room (when possible), furniture and car seat. Hand-washing is important throughout the day, especially after handling raw meat and poultry, changing the baby's diaper or using the bathroom. This can help prevent the spread of many germs (bacteria) and viruses that cause infection. Your Hair may become dry and thinner, but will return to normal a few weeks after the baby is born. Heartburn is a common problem that can be treated by taking antacids recommended by your caregiver, eating smaller meals 5 or 6 times a day, not drinking liquids when eating, drinking between meals and raising the head of you bed 2 to 3 inches. I Insurance to cover you, the baby, doctor and hospital should be reviewed so that you will be prepared to pay any costs not covered by your insurance plan. If you do not have medical insurance, there are usually clinics and services available for you in your community. Take 30 milligrams of Iron during  your pregnancy as prescribed by your doctor to reduce the risk of  low red blood cells (anemia) later in pregnancy. All women of childbearing age should eat a diet rich in iron. J There should be a Joint effort for the mother, father and any other children to adapt to the pregnancy financially, emotionally and psychologically during the pregnancy. Join a support group for moms-to-be. Or, join a class on parenting or childbirth. Have the family participate when possible. K Know your limits. Let your caregiver know if you experience any of the  following:   Pain of any kind.  Strong cramps.   You develop a lot of weight in a short period of time (5 pounds in 3 to 5 days).   Vaginal bleeding, leaking of amniotic fluid.   Headache, vision problems.   Dizziness, fainting, shortness of breath.   Chest pain.   Fever of 100.4 or higher.   Gush of clear fluid from your vagina.   Painful urination.   Domestic violence.   Irregular heartbeat (palpitations).  Rapid beating of the heart (tachycardia).   Constant feeling sick to your stomach (nauseous) and vomiting.   Trouble walking, fluid retention (edema).   Muscle weakness.   If your baby has decreased activity.   Persistent diarrhea.   Abnormal vaginal discharge.   Uterine contractions at 20-minute intervals.   Back pain that travels down your leg.   L Learn and practice that what you eat and drink should be in moderation and healthy for you and your baby. Legal drugs such as alcohol and caffeine are important issues for pregnant women. There is no safe amount of alcohol a woman can drink while pregnant. Fetal alcohol syndrome, a disorder characterized by growth retardation, facial abnormalities, and central nervous system dysfunction, is caused by a woman's use of alcohol during pregnancy. Caffeine, found in tea, coffee, soft drinks and chocolate, should also be limited. Be sure to read labels when trying to cut down on caffeine during pregnancy. More than 200 foods, beverages, and over-the-counter medications contain caffeine and have a high salt content! There are coffees and teas that do not contain caffeine.  M Medical conditions such as diabetes, epilepsy, and high blood pressure should be treated and kept under control before pregnancy when possible, but especially during pregnancy. Ask your caregiver about any medications that may need to be changed or adjusted during pregnancy. If you are currently taking any medications, ask your caregiver if it is safe to  take them while you are pregnant or before getting pregnant when possible. Also, be sure to discuss any herbs or vitamins you are taking. They are medicines, too! Discuss with your doctor all medications, prescribed and over-the-counter, that you are taking. During your prenatal visit, discuss the medications your doctor may give you during labor and delivery. N Never be afraid to ask your doctor or caregiver questions about your health, the progress of the pregnancy, family problems, stressful situations, and recommendation for a pediatrician, if you do not have one. It is better to take all precautions and discuss any questions or concerns you may have during your office visits. It is a good idea to write down your questions before you visit the doctor. O Over-the-counter cough and cold remedies may contain alcohol or other ingredients that should be avoided during pregnancy. Ask your caregiver about prescription, herbs or over-the-counter medications that you are taking or may consider taking while pregnant.  P Physical activity during pregnancy can benefit both  you and your baby by lessening discomfort and fatigue, providing a sense of well-being, and increasing the likelihood of early recovery after delivery. Light to moderate exercise during pregnancy strengthens the belly (abdominal) and back muscles. This helps improve posture. Practicing yoga, walking, swimming, and cycling on a stationary bicycle are usually safe exercises for pregnant women. Avoid scuba diving, exercise at high altitudes (over 3000 feet), skiing, horseback riding, contact sports, etc. Always check with your doctor before beginning any kind of exercise, especially during pregnancy and especially if you did not exercise before getting pregnant. Q Queasiness, stomach upset and morning sickness are common during pregnancy. Eating a couple of crackers or dry toast before getting out of bed. Foods that you normally love may make you feel  sick to your stomach. You may need to substitute other nutritious foods. Eating 5 or 6 small meals a day instead of 3 large ones may make you feel better. Do not drink with your meals, drink between meals. Questions that you have should be written down and asked during your prenatal visits. R Read about and make plans to baby-proof your home. There are important tips for making your home a safer environment for your baby. Review the tips and make your home safer for you and your baby. Read food labels regarding calories, salt and fat content in the food. S Saunas, hot tubs, and steam rooms should be avoided while you are pregnant. Excessive high heat may be harmful during your pregnancy. Your caregiver will screen and examine you for sexually transmitted diseases and genetic disorders during your prenatal visits. Learn the Signs of labor. Sexual relations while pregnant is safe unless there is a medical or pregnancy problem and your caregiver advises against it. T Traveling long distances should be avoided especially in the third trimester of your pregnancy. If you do have to travel out of state, be sure to take a copy of your medical records and medical insurance plan with you. You should not travel long distances without seeing your doctor first. Most airlines will not allow you to travel after 36 weeks of pregnancy. Toxoplasmosis is an infection caused by a parasite that can seriously harm an unborn baby. Avoid eating undercooked meat and handling cat litter. Be sure to wear gloves when gardening. Tingling of the hands and fingers is not unusual and is due to fluid retention. This will go away after the baby is born. U Womb (uterus) size increases during the first trimester. Your kidneys will begin to function more efficiently. This may cause you to feel the need to Urinate more often. You may also leak urine when sneezing, coughing or laughing. This is due to the growing uterus pressing against your bladder,  which lies directly in front of and slightly under the uterus during the first few months of pregnancy. If you experience burning along with frequency of urination or bloody urine, be sure to tell your doctor. The size of your uterus in the third trimester may cause a problem with your balance. It is advisable to maintain good posture and avoid wearing high heels during this time. An Ultrasound of your baby may be necessary during your pregnancy and is safe for you and your baby. V Vaccinations are an important concern for pregnant women. Get needed vaccines before pregnancy. Center for Disease Control ([employer]) has clear guidelines for the use of vaccines during pregnancy. Review the list, be sure to discuss it with your doctor. Prenatal Vitamins are helpful and healthy  for you and the baby. Do not take extra vitamins except what is recommended. Taking too much of certain vitamins can cause overdose problems. Continuous Vomiting should be reported to your caregiver. Varicose veins may appear especially if there is a family history of varicose veins. They should subside after the delivery of the baby. Support hose helps if there is leg discomfort. W Being overweight or underweight during pregnancy may cause problems. Try to get within 15 pounds of your ideal Weight before pregnancy. Remember, pregnancy is not a time to be dieting! Do not stop eating or start skipping meals as your weight increases. Both you and your baby need the calories and nutrition you receive from a healthy diet. Be sure to consult with your doctor about your diet. There is a formula and diet plan available depending on whether you are overweight or underweight. Your caregiver or nutritionist can help and advise you if necessary. X Avoid X-rays. If you must have dental work or diagnostic tests, tell your dentist or physician that you are pregnant so that extra care can be taken. X-rays should only be taken when the risks of not taking  them outweigh the risk of taking them. If needed, only the minimum amount of radiation should be used. When X-rays are necessary, protective lead shields should be used to cover areas of the body that are not being X-rayed. Y Your baby loves you. Breastfeeding your baby creates a loving and very close bond between the two of you. Give your baby a healthy environment to live in while you are pregnant. Infants and children require constant care and guidance. Their health and safety should be carefully watched at all times. After the baby is born, rest or take a nap when the baby is sleeping. Z Get your [phone]. Be sure to get plenty of rest. Resting on your side as often as possible, especially on your left side is advised. It provides the best circulation to your baby and helps reduce swelling. Try taking a nap for thirty to forty five minutes in the afternoon when possible. After the baby is born rest or take a nap when the baby is sleeping. Try elevating your feet for that amount of time when possible. It helps the circulation in your legs and helps reduce swelling.  Most information courtesy of the CDC. Document Released: 12/19/2004 Document Re-Released: 03/15/2009 [facility] Patient Information 2011 [place], [place].

## 2010-07-11 NOTE — Progress Notes (Signed)
Pt states she had a POS UPT at [facility] a few weeks ago. Has been having nausea and vomiting for a 2 weeks and is getting worse, unable to keep anything down.

## 2010-07-11 NOTE — ED Notes (Signed)
Discussed with pt., small frequent feedings and drinking small amts. fluids all day. E. Rice PA in to discuss status and D/C instrs.

## 2010-07-28 IMAGING — US US OB DETAIL+14 WK
1 of 2 series · 14 of 28 positions shown · non-contrast
Comparison: none

OBSTETRICAL ULTRASOUND:
 This ultrasound exam was performed in the [HOSPITAL] Ultrasound Department.  The OB US report was generated in the AS system, and faxed to the ordering physician.  This report is also available in [REDACTED] PACS.

[Series 1: us ob detail +14 wk · 0.27mm/px · 14 of 72 slices shown]
[im 1/72]
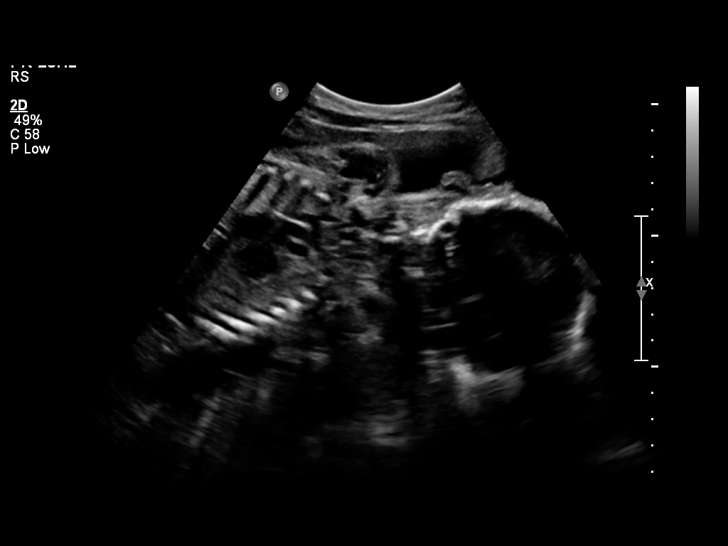
[im 6/72]
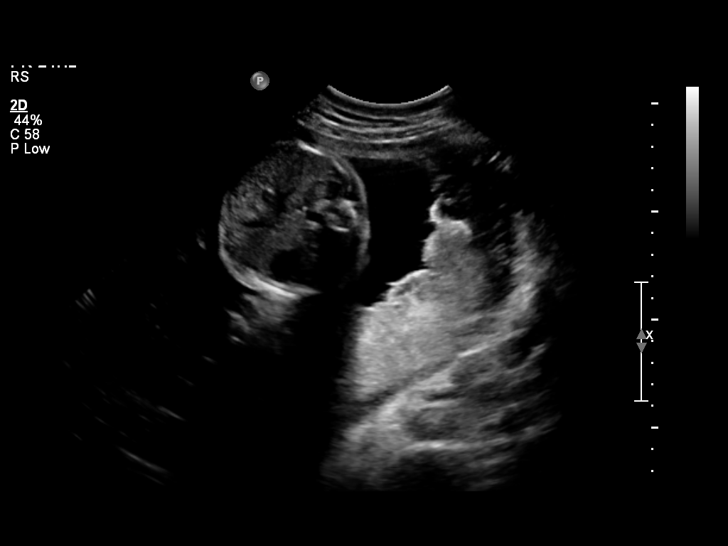
[im 11/72]
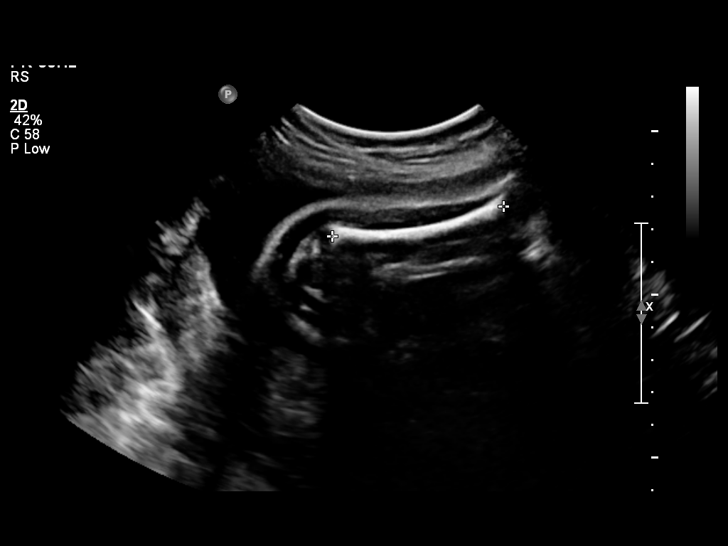
[im 17/72]
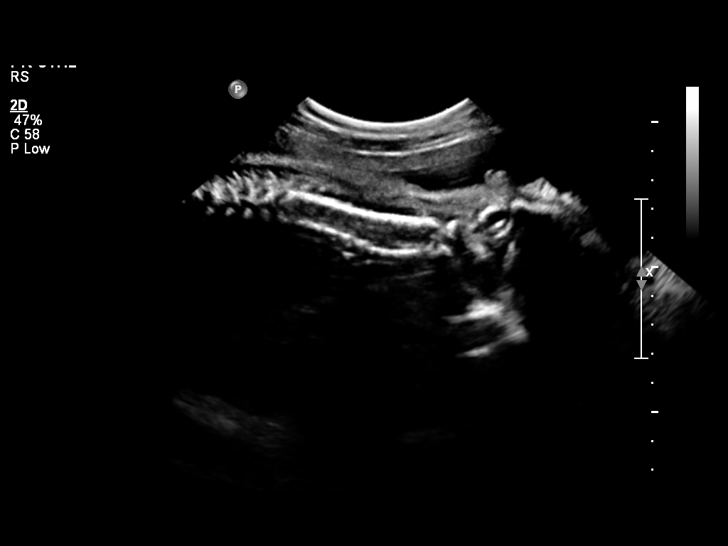
[im 22/72]
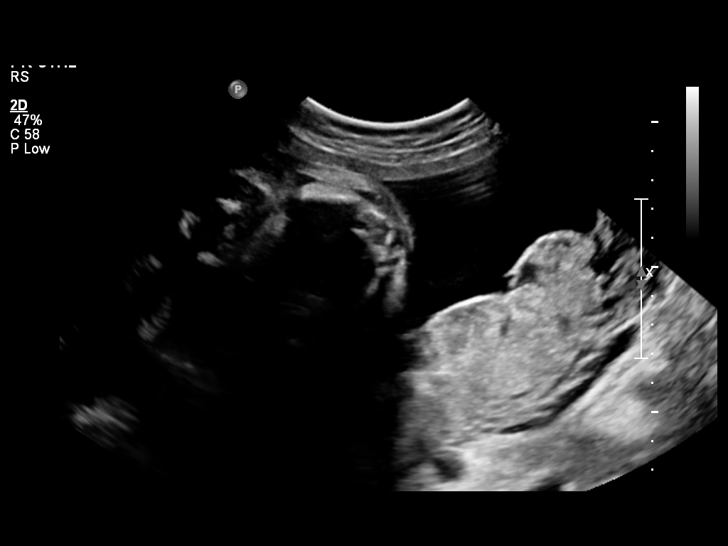
[im 28/72]
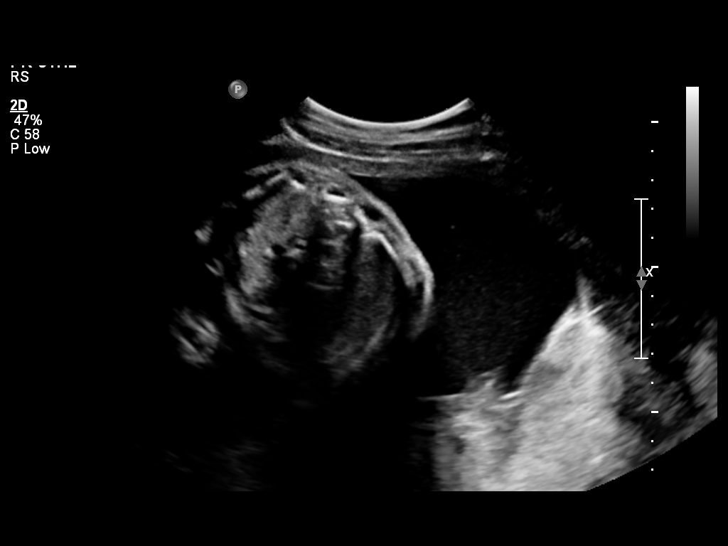
[im 33/72]
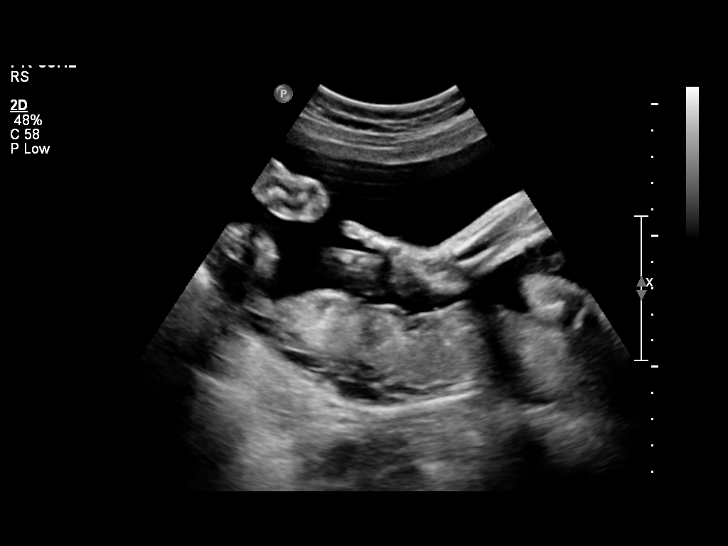
[im 39/72]
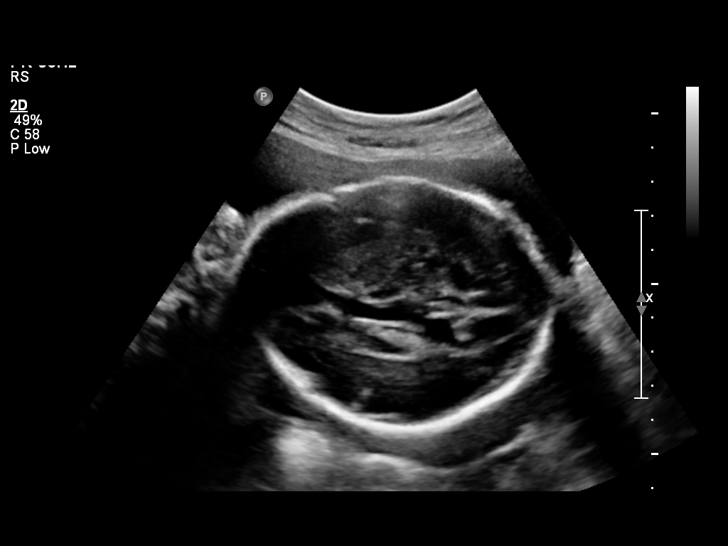
[im 44/72]
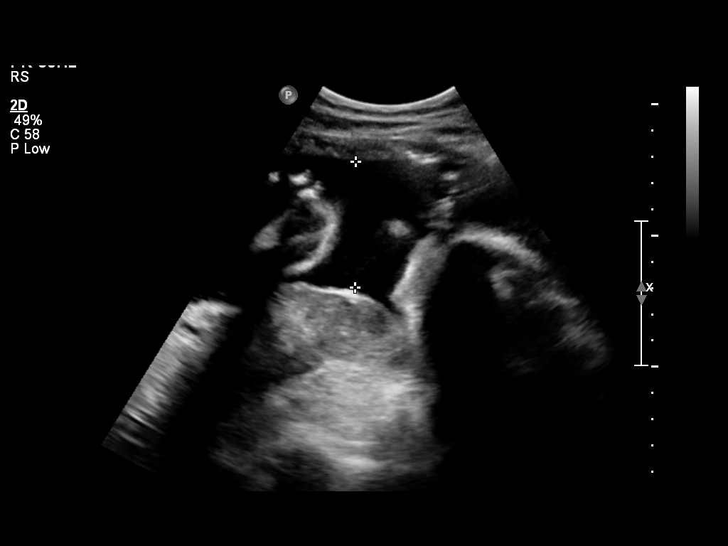
[im 50/72]
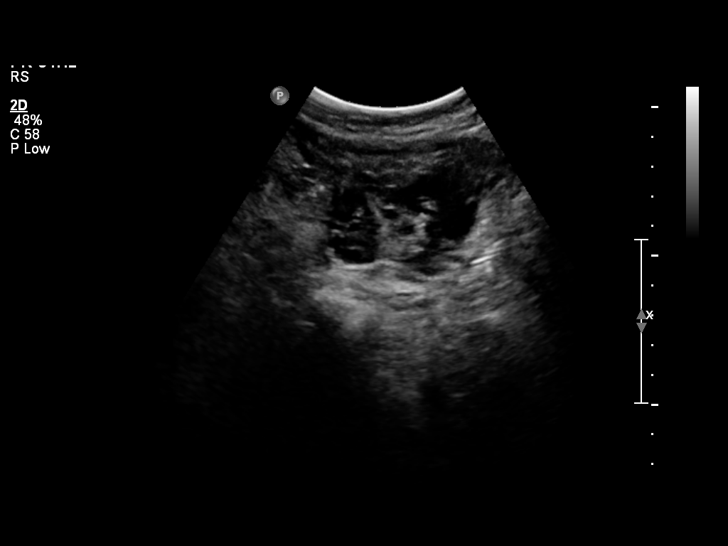
[im 55/72]
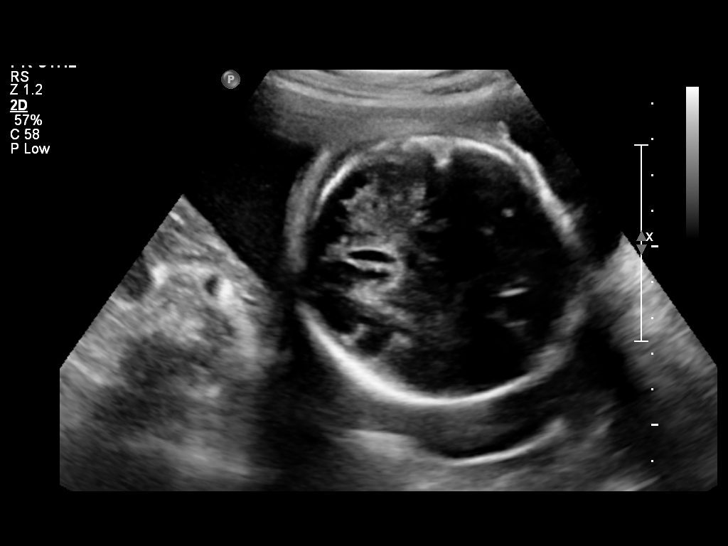
[im 61/72]
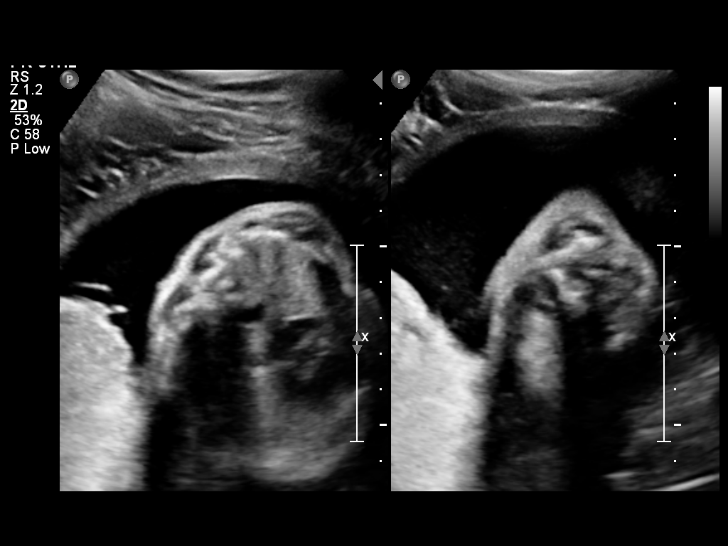
[im 66/72]
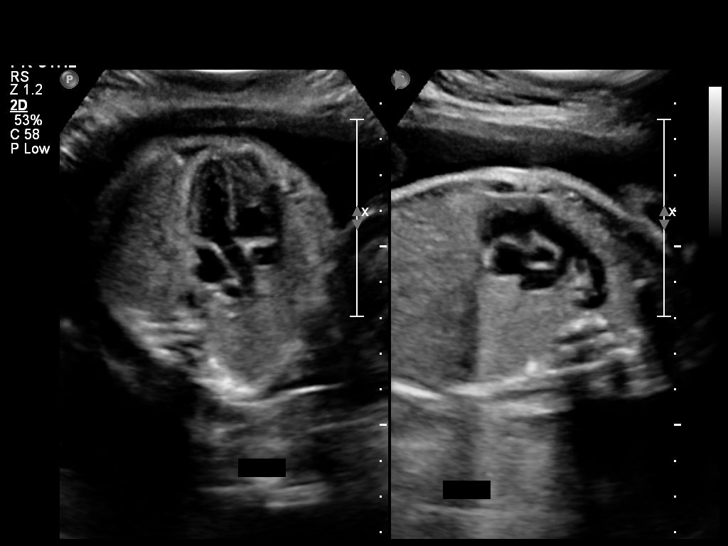
[im 72/72]
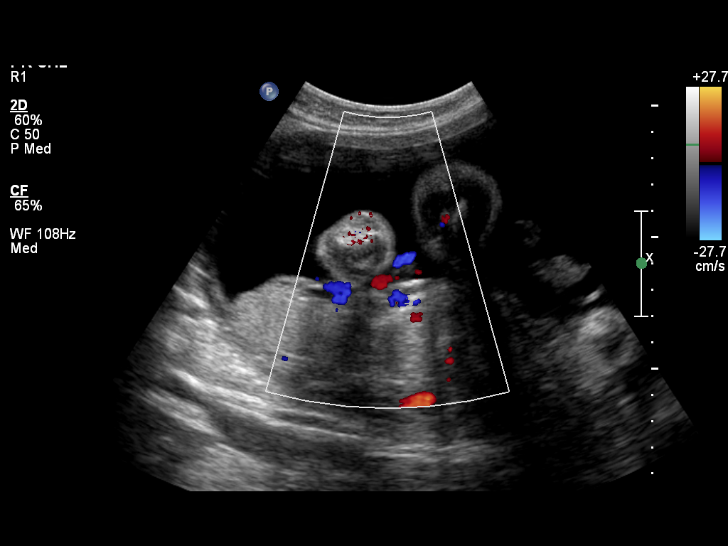

[14 of 28 positions shown; findings below may reference images not displayed]

IMPRESSION: See AS Obstetric US report.

## 2010-09-08 LAB — HEPATITIS B SURFACE ANTIGEN: Hepatitis B Surface Ag: NEGATIVE

## 2010-09-08 LAB — RUBELLA ANTIBODY, IGM: Rubella: IMMUNE

## 2010-10-07 ENCOUNTER — Encounter (HOSPITAL_COMMUNITY): Payer: Self-pay | Admitting: *Deleted

## 2011-01-03 NOTE — L&D Delivery Note (Signed)
Delivery Note Telephone call from L&D at 1916, and no voicemail left, and I was talking on the phone to a patient who had called after-hours line.  Continued talking to the patient on the phone, and at 1920 received another phone call from L&D, but unable to answer again, as patient on the line was still talking, so walked out of call room into L&D hall and charge RN going into Erin Archer's room and she was motioning me to come into room.  Continued trying to get patient on phone to let me go, and as I walked into Erin Archer's room, Dr. Adrian Blackwater of faculty practice was at bedside, and newborn's head was out, and he was attempting to deliver body, but having difficulty with shoulders.  Pt's legs were extended and she was sliding up the head of the bed, making delivery difficult.  As I was putting on my gloves, Dr. Adrian Blackwater delivered the newborn, and per RN, approximately 1 minute shoulder dystocia, but appeared difficulty was with patient's position and her inability to keep legs back because of pain.  I then took over pt's care from Dr. Adrian Blackwater and placed newborn on pt's abdomen where newborn was dried and stimulated and had spontaneous cry.    At 7:20 PM a viable female "Erin Archer" was delivered via Vaginal, Spontaneous Delivery (Presentation: Left Occiput Anterior).  APGAR: 9, 9; weight 7 lb 4.8 oz (3310 g).   Placenta status: Intact, Spontaneous.  Cord: 3 vessels with the following complications: None.  Cord pH: n/a.  Anesthesia: None  Episiotomy: None Lacerations: None Suture Repair: n/a Est. Blood Loss (mL):  Mom to postpartum.  Baby to nursery-stable. Pt plans to formula feed and does verbalize desire to proceed with BTL. Dr. Normand Sloop notified of pt's desire, & she is to post sx for tomorrow.  Currently, order placed for pt to be NPO after MN until further notice.    [name] H 02/08/2011, 9:48 PM

## 2011-01-19 LAB — STREP B DNA PROBE: GBS: POSITIVE

## 2011-02-08 ENCOUNTER — Encounter (HOSPITAL_COMMUNITY): Payer: Self-pay | Admitting: *Deleted

## 2011-02-08 ENCOUNTER — Other Ambulatory Visit: Payer: Self-pay | Admitting: Obstetrics and Gynecology

## 2011-02-08 ENCOUNTER — Encounter: Payer: Self-pay | Admitting: Obstetrics and Gynecology

## 2011-02-08 ENCOUNTER — Inpatient Hospital Stay (HOSPITAL_COMMUNITY)
Admission: AD | Admit: 2011-02-08 | Discharge: 2011-02-10 | DRG: 775 | Disposition: A | Discharge status: Home / Self Care | Payer: Medicaid Other | Source: Ambulatory Visit | Attending: Obstetrics and Gynecology | Admitting: Obstetrics and Gynecology

## 2011-02-08 DIAGNOSIS — D573 Sickle-cell trait: Secondary | ICD-10-CM | POA: Diagnosis present

## 2011-02-08 DIAGNOSIS — Z22330 Carrier of Group B streptococcus: Secondary | ICD-10-CM

## 2011-02-08 DIAGNOSIS — O99892 Other specified diseases and conditions complicating childbirth: Principal | ICD-10-CM | POA: Diagnosis present

## 2011-02-08 DIAGNOSIS — O99345 Other mental disorders complicating the puerperium: Secondary | ICD-10-CM | POA: Insufficient documentation

## 2011-02-08 DIAGNOSIS — Z87448 Personal history of other diseases of urinary system: Secondary | ICD-10-CM | POA: Insufficient documentation

## 2011-02-08 DIAGNOSIS — IMO0001 Reserved for inherently not codable concepts without codable children: Secondary | ICD-10-CM

## 2011-02-08 DIAGNOSIS — D696 Thrombocytopenia, unspecified: Secondary | ICD-10-CM | POA: Diagnosis present

## 2011-02-08 DIAGNOSIS — O9902 Anemia complicating childbirth: Secondary | ICD-10-CM | POA: Diagnosis present

## 2011-02-08 DIAGNOSIS — F53 Postpartum depression: Secondary | ICD-10-CM

## 2011-02-08 DIAGNOSIS — O0930 Supervision of pregnancy with insufficient antenatal care, unspecified trimester: Secondary | ICD-10-CM | POA: Insufficient documentation

## 2011-02-08 DIAGNOSIS — D689 Coagulation defect, unspecified: Secondary | ICD-10-CM | POA: Diagnosis present

## 2011-02-08 HISTORY — DX: Urinary tract infection, site not specified: N39.0

## 2011-02-08 LAB — CBC
Hemoglobin: 11.4 g/dL — ABNORMAL LOW (ref 12.0–15.0)
MCH: 24.5 pg — ABNORMAL LOW (ref 26.0–34.0)
MCHC: 36.4 g/dL — ABNORMAL HIGH (ref 30.0–36.0)
Platelets: 125 10*3/uL — ABNORMAL LOW (ref 150–400)

## 2011-02-08 MED ORDER — TETANUS-DIPHTH-ACELL PERTUSSIS 5-2.5-18.5 LF-MCG/0.5 IM SUSP: 0.5000 mL | Freq: Once | INTRAMUSCULAR | Status: DC

## 2011-02-08 MED ORDER — OXYTOCIN 20 UNITS IN LACTATED RINGERS INFUSION - SIMPLE: 125.0000 mL/h | Freq: Once | INTRAVENOUS | Status: DC

## 2011-02-08 MED ORDER — SENNOSIDES-DOCUSATE SODIUM 8.6-50 MG PO TABS
2.0000 | ORAL_TABLET | Freq: Every day | ORAL | Status: DC
  Administered 2011-02-09: 2 via ORAL

## 2011-02-08 MED ORDER — PHENYLEPHRINE 40 MCG/ML (10ML) SYRINGE FOR IV PUSH (FOR BLOOD PRESSURE SUPPORT)
80.0000 ug | PREFILLED_SYRINGE | INTRAVENOUS | Status: DC | PRN
  Filled 2011-02-08: qty 5

## 2011-02-08 MED ORDER — ONDANSETRON HCL 4 MG PO TABS: 4.0000 mg | ORAL_TABLET | ORAL | Status: DC | PRN

## 2011-02-08 MED ORDER — IBUPROFEN 600 MG PO TABS
600.0000 mg | ORAL_TABLET | Freq: Four times a day (QID) | ORAL | Status: DC | PRN
  Administered 2011-02-08: 600 mg via ORAL
  Filled 2011-02-08: qty 1

## 2011-02-08 MED ORDER — OXYTOCIN BOLUS FROM INFUSION
500.0000 mL | Freq: Once | INTRAVENOUS | Status: DC
  Filled 2011-02-08: qty 500
  Filled 2011-02-08: qty 1000

## 2011-02-08 MED ORDER — ONDANSETRON HCL 4 MG/2ML IJ SOLN: 4.0000 mg | INTRAMUSCULAR | Status: DC | PRN

## 2011-02-08 MED ORDER — LACTATED RINGERS IV SOLN: 500.0000 mL | Freq: Once | INTRAVENOUS | Status: DC

## 2011-02-08 MED ORDER — EPHEDRINE 5 MG/ML INJ: 10.0000 mg | INTRAVENOUS | Status: DC | PRN

## 2011-02-08 MED ORDER — BENZOCAINE-MENTHOL 20-0.5 % EX AERO: 1.0000 "application " | INHALATION_SPRAY | CUTANEOUS | Status: DC | PRN

## 2011-02-08 MED ORDER — DIPHENHYDRAMINE HCL 25 MG PO CAPS: 25.0000 mg | ORAL_CAPSULE | Freq: Four times a day (QID) | ORAL | Status: DC | PRN

## 2011-02-08 MED ORDER — METHYLERGONOVINE MALEATE 0.2 MG/ML IJ SOLN: 0.2000 mg | INTRAMUSCULAR | Status: DC | PRN

## 2011-02-08 MED ORDER — CITRIC ACID-SODIUM CITRATE 334-500 MG/5ML PO SOLN: 30.0000 mL | ORAL | Status: DC | PRN

## 2011-02-08 MED ORDER — IBUPROFEN 600 MG PO TABS
600.0000 mg | ORAL_TABLET | Freq: Four times a day (QID) | ORAL | Status: DC
  Administered 2011-02-09 – 2011-02-10 (×6): 600 mg via ORAL
  Filled 2011-02-08 (×6): qty 1

## 2011-02-08 MED ORDER — DIPHENHYDRAMINE HCL 50 MG/ML IJ SOLN: 12.5000 mg | INTRAMUSCULAR | Status: DC | PRN

## 2011-02-08 MED ORDER — LIDOCAINE HCL (PF) 1 % IJ SOLN
30.0000 mL | INTRAMUSCULAR | Status: DC | PRN
  Filled 2011-02-08: qty 30

## 2011-02-08 MED ORDER — FENTANYL 2.5 MCG/ML BUPIVACAINE 1/10 % EPIDURAL INFUSION (WH - ANES)
14.0000 mL/h | INTRAMUSCULAR | Status: DC
  Filled 2011-02-08: qty 60

## 2011-02-08 MED ORDER — ACETAMINOPHEN 325 MG PO TABS: 650.0000 mg | ORAL_TABLET | ORAL | Status: DC | PRN

## 2011-02-08 MED ORDER — FLEET ENEMA 7-19 GM/118ML RE ENEM: 1.0000 | ENEMA | RECTAL | Status: DC | PRN

## 2011-02-08 MED ORDER — LACTATED RINGERS IV SOLN
INTRAVENOUS | Status: DC
  Administered 2011-02-08: 18:00:00 via INTRAVENOUS

## 2011-02-08 MED ORDER — METHYLERGONOVINE MALEATE 0.2 MG PO TABS: 0.2000 mg | ORAL_TABLET | ORAL | Status: DC | PRN

## 2011-02-08 MED ORDER — LANOLIN HYDROUS EX OINT: TOPICAL_OINTMENT | CUTANEOUS | Status: DC | PRN

## 2011-02-08 MED ORDER — EPHEDRINE 5 MG/ML INJ
10.0000 mg | INTRAVENOUS | Status: DC | PRN
  Filled 2011-02-08: qty 4

## 2011-02-08 MED ORDER — LACTATED RINGERS IV SOLN
500.0000 mL | INTRAVENOUS | Status: DC | PRN
  Administered 2011-02-08: 1000 mL via INTRAVENOUS

## 2011-02-08 MED ORDER — SIMETHICONE 80 MG PO CHEW: 80.0000 mg | CHEWABLE_TABLET | ORAL | Status: DC | PRN

## 2011-02-08 MED ORDER — BUTORPHANOL TARTRATE 2 MG/ML IJ SOLN: 1.0000 mg | INTRAMUSCULAR | Status: DC | PRN

## 2011-02-08 MED ORDER — PHENYLEPHRINE 40 MCG/ML (10ML) SYRINGE FOR IV PUSH (FOR BLOOD PRESSURE SUPPORT): 80.0000 ug | PREFILLED_SYRINGE | INTRAVENOUS | Status: DC | PRN

## 2011-02-08 MED ORDER — DIBUCAINE 1 % RE OINT: 1.0000 "application " | TOPICAL_OINTMENT | RECTAL | Status: DC | PRN

## 2011-02-08 MED ORDER — OXYCODONE-ACETAMINOPHEN 5-325 MG PO TABS
1.0000 | ORAL_TABLET | ORAL | Status: DC | PRN
  Administered 2011-02-08: 1 via ORAL
  Filled 2011-02-08: qty 1

## 2011-02-08 MED ORDER — WITCH HAZEL-GLYCERIN EX PADS: 1.0000 "application " | MEDICATED_PAD | CUTANEOUS | Status: DC | PRN

## 2011-02-08 MED ORDER — MEDROXYPROGESTERONE ACETATE 150 MG/ML IM SUSP: 150.0000 mg | INTRAMUSCULAR | Status: DC | PRN

## 2011-02-08 MED ORDER — PRENATAL MULTIVITAMIN CH
1.0000 | ORAL_TABLET | Freq: Every day | ORAL | Status: DC
  Administered 2011-02-10: 1 via ORAL
  Filled 2011-02-08: qty 1

## 2011-02-08 MED ORDER — OXYCODONE-ACETAMINOPHEN 5-325 MG PO TABS
1.0000 | ORAL_TABLET | ORAL | Status: DC | PRN
  Administered 2011-02-09 (×2): 1 via ORAL
  Filled 2011-02-08 (×2): qty 1

## 2011-02-08 MED ORDER — ONDANSETRON HCL 4 MG/2ML IJ SOLN: 4.0000 mg | Freq: Four times a day (QID) | INTRAMUSCULAR | Status: DC | PRN

## 2011-02-08 MED ORDER — SODIUM CHLORIDE 0.9 % IV SOLN
2.0000 g | Freq: Once | INTRAVENOUS | Status: AC
  Administered 2011-02-08: 2 g via INTRAVENOUS
  Filled 2011-02-08: qty 2000

## 2011-02-08 MED ORDER — ZOLPIDEM TARTRATE 5 MG PO TABS: 5.0000 mg | ORAL_TABLET | Freq: Every evening | ORAL | Status: DC | PRN

## 2011-02-08 MED ORDER — MAGNESIUM HYDROXIDE 400 MG/5ML PO SUSP: 30.0000 mL | ORAL | Status: DC | PRN

## 2011-02-08 NOTE — Progress Notes (Signed)
Requests vag exam, pelvic pressure O Fhts 130s LTV mod early and variable decels     uc q 1-6 mod     Vag unchanged     PLT 125 A term pg active labor P epidural per pt request Lavera Guise, CNM

## 2011-02-08 NOTE — Progress Notes (Signed)
Sent from office, is dilated. Is contracting, having mucous d/c.

## 2011-02-08 NOTE — H&P (Signed)
Erin Archer is a 26 y.o. female presenting for c/o of contractions since this morning, mucus with blood ting, denies srom, with +FM, undecided about epidural.Taking OTC meds for stuffy nose that started 2/5 without sore throat. PG significant for: GBS+ HX pp Depression x 2 Hgb B Thalassemia Thrombocytopenia LVEIF HX rapid labor Late PNC HX pyelonephritis  History OB History    Grav Para Term Preterm Abortions TAB SAB Ect Mult Living   5 2 2  2 2    2      Past Medical History  Diagnosis Date  . Sickle cell trait   . Thrombocytopenia   . Complication of anesthesia     back spasms after del/epid  . Urinary tract infection    Past Surgical History  Procedure Date  . Induced abortion     x2   Family History: family history is negative for Anesthesia problems. Social History:  reports that she has quit smoking. She has never used smokeless tobacco. She reports that she drinks about .6 ounces of alcohol per week. She reports that she uses illicit drugs (Marijuana).  ROS  Dilation: 6 Effacement (%): 100 Station: -1 Exam by:: Erin Archer Blood pressure 124/72, pulse 100, temperature 98.2 F (36.8 C), temperature source Oral, resp. rate 20, height 5\' 5"  (1.651 m), weight 163 lb (73.936 kg). Exam Physical Exam Lungs clear bilaterally, AP regular rate, bowel sounds active, abd soft, gravid, nt, EGBUS WNL, no edema lower extremities Fhts 120s LTV mod early, variable decels          uc q 3-4 mod          Vag 6 100 -1 VTX I  Prenatal labs: ABO, Rh:  A pos Antibody:  neg Rubella: Immune (09/06 0000) RPR: Nonreactive (09/06 0000)  HBsAg: Negative (09/06 0000)  HIV: Non-reactive (09/06 0000)  GBS: Positive (01/17 0000)   Assessment/Plan: 39 5/7 week IUP active labor GBS+  Plan Ampicillin 2 gm IV, epidural if desires, collaboration with Erin Archer per telephone. Erin Archer 02/08/2011, 5:39 PM

## 2011-02-08 NOTE — Progress Notes (Signed)
Per Dr. Normand Sloop, pt's BTL scheduled for tomorrow, 02/09/11 at noon.  Pt to be NPO after 4am.

## 2011-02-08 NOTE — Progress Notes (Addendum)
Asked to come to a precipitous delivery.  When I arrived, head had been delivered for 30 seconds.  [employer] immediately employed.  Infant found to be LOA with left hand by face.  Baby was rapidly delivered after the delivery of posterior shoulder.  Care turned over to [facility], CNM.   Erin Archer 02/08/2011 7:28 PM

## 2011-02-09 ENCOUNTER — Inpatient Hospital Stay (HOSPITAL_COMMUNITY): Payer: Medicaid Other | Admitting: Anesthesiology

## 2011-02-09 ENCOUNTER — Encounter (HOSPITAL_COMMUNITY)
Admission: AD | Disposition: A | Discharge status: Home / Self Care | Payer: Self-pay | Source: Ambulatory Visit | Attending: Obstetrics and Gynecology

## 2011-02-09 ENCOUNTER — Encounter (HOSPITAL_COMMUNITY): Payer: Self-pay | Admitting: Anesthesiology

## 2011-02-09 HISTORY — PX: TUBAL LIGATION: SHX77

## 2011-02-09 LAB — CBC
HCT: 25.4 % — ABNORMAL LOW (ref 36.0–46.0)
MCH: 23.7 pg — ABNORMAL LOW (ref 26.0–34.0)
MCHC: 35.4 g/dL (ref 30.0–36.0)
MCV: 67 fL — ABNORMAL LOW (ref 78.0–100.0)
Platelets: 119 10*3/uL — ABNORMAL LOW (ref 150–400)
RDW: 18 % — ABNORMAL HIGH (ref 11.5–15.5)

## 2011-02-09 LAB — RPR: RPR Ser Ql: NONREACTIVE

## 2011-02-09 SURGERY — LIGATION, FALLOPIAN TUBE, POSTPARTUM
Anesthesia: Epidural | Laterality: Bilateral

## 2011-02-09 MED ORDER — MIDAZOLAM HCL 2 MG/2ML IJ SOLN
INTRAMUSCULAR | Status: AC
  Filled 2011-02-09: qty 2

## 2011-02-09 MED ORDER — SODIUM BICARBONATE 8.4 % IV SOLN
INTRAVENOUS | Status: AC
  Filled 2011-02-09: qty 50

## 2011-02-09 MED ORDER — FENTANYL CITRATE 0.05 MG/ML IJ SOLN
INTRAMUSCULAR | Status: AC
  Filled 2011-02-09: qty 2

## 2011-02-09 MED ORDER — FAMOTIDINE 20 MG PO TABS
40.0000 mg | ORAL_TABLET | Freq: Once | ORAL | Status: AC
  Administered 2011-02-09: 40 mg via ORAL
  Filled 2011-02-09 (×2): qty 1

## 2011-02-09 MED ORDER — LACTATED RINGERS IV SOLN
INTRAVENOUS | Status: DC
  Administered 2011-02-09: 10:00:00 via INTRAVENOUS

## 2011-02-09 MED ORDER — LIDOCAINE-EPINEPHRINE (PF) 2 %-1:200000 IJ SOLN
INTRAMUSCULAR | Status: AC
  Filled 2011-02-09: qty 20

## 2011-02-09 MED ORDER — ONDANSETRON HCL 4 MG/2ML IJ SOLN
INTRAMUSCULAR | Status: AC
  Filled 2011-02-09: qty 2

## 2011-02-09 MED ORDER — METOCLOPRAMIDE HCL 10 MG PO TABS
10.0000 mg | ORAL_TABLET | Freq: Once | ORAL | Status: AC
  Administered 2011-02-09: 10 mg via ORAL
  Filled 2011-02-09: qty 1

## 2011-02-09 SURGICAL SUPPLY — 23 items
CHLORAPREP W/TINT 26ML (MISCELLANEOUS) IMPLANT
CLOTH BEACON ORANGE TIMEOUT ST (SAFETY) IMPLANT
CONTAINER PREFILL 10% NBF 15ML (MISCELLANEOUS) IMPLANT
ELECT REM PT RETURN 9FT ADLT (ELECTROSURGICAL)
ELECTRODE REM PT RTRN 9FT ADLT (ELECTROSURGICAL) IMPLANT
GLOVE BIOGEL PI IND STRL 8.5 (GLOVE) IMPLANT
GLOVE BIOGEL PI INDICATOR 8.5 (GLOVE)
GLOVE ECLIPSE 8.0 STRL XLNG CF (GLOVE) IMPLANT
GOWN PREVENTION PLUS LG XLONG (DISPOSABLE) IMPLANT
GOWN PREVENTION PLUS XXLARGE (GOWN DISPOSABLE) IMPLANT
NEEDLE HYPO 25X1 1.5 SAFETY (NEEDLE) IMPLANT
NS IRRIG 1000ML POUR BTL (IV SOLUTION) IMPLANT
PACK ABDOMINAL MINOR (CUSTOM PROCEDURE TRAY) IMPLANT
PENCIL BUTTON HOLSTER BLD 10FT (ELECTRODE) IMPLANT
SPONGE LAP 4X18 X RAY DECT (DISPOSABLE) IMPLANT
SUT MNCRL AB 4-0 PS2 18 (SUTURE) IMPLANT
SUT PLAIN 0 NONE (SUTURE) IMPLANT
SUT VIC AB 2-0 CT1 27 (SUTURE)
SUT VIC AB 2-0 CT1 TAPERPNT 27 (SUTURE) IMPLANT
SYR CONTROL 10ML LL (SYRINGE) IMPLANT
TOWEL OR 17X24 6PK STRL BLUE (TOWEL DISPOSABLE) IMPLANT
TRAY FOLEY CATH 14FR (SET/KITS/TRAYS/PACK) IMPLANT
WATER STERILE IRR 1000ML POUR (IV SOLUTION) ×2 IMPLANT

## 2011-02-09 NOTE — Progress Notes (Signed)
Comfortable some cramping, bottle feeding, little bleeding O VSS      abd soft, gravid, nt      Ff sm rubra flow      Perineum clean intact      -Homan's sign bilaterally A pp day 1 nonlactating    Normal involution P PP BTL today, discussed frequent voids, Lavera Guise, CNM

## 2011-02-09 NOTE — Anesthesia Preprocedure Evaluation (Deleted)

## 2011-02-09 NOTE — Progress Notes (Signed)
Sw attempted to see pt to assess, history of PP depression and MJ use however she did not want to talk with FOB present.  FOB was sleeping and asked Sw to return at another time.

## 2011-02-09 NOTE — Progress Notes (Signed)
UR Chart review completed.  

## 2011-02-10 MED ORDER — FE BISGLYCINATE-FE POLYSAC 40-20 MG PO CAPS: 1.0000 | ORAL_CAPSULE | Freq: Two times a day (BID) | ORAL | Status: DC

## 2011-02-10 MED ORDER — IBUPROFEN 600 MG PO TABS: 600.0000 mg | ORAL_TABLET | Freq: Four times a day (QID) | ORAL | Status: AC

## 2011-02-10 NOTE — Progress Notes (Signed)
PSYCHOSOCIAL ASSESSMENT ~ MATERNAL/CHILD Name: Erin Archer                                                                                             Age: 27 days  Referral Date: 02/09/11   Reason/Source: Hx of PPD/CN  I. FAMILY/HOME ENVIRONMENT A. Child's Legal Guardian _x__Parent(s) ___Grandparent ___Foster parent ___DSS_________________ Name: Erin Archer                                   DOB: [birth year]                     Age: 26  Address: [address] [place], [place], [place] [address]  Name: Erin Archer                                          DOB: //                                Age:   Address: Does not live with [name]  B. Other Household Members/Support Persons Name: Erin Archer (7)                         Relationship: brother           DOB 11/05                   Name: Erin Archer (2)                      Relationship: brother           DOB [birth year]/10                   Name:                                         Relationship:                        DOB ___/___/___                   Name:                                         Relationship:                        DOB ___/___/___  C. Other Support: MGM, MGGM and MGF are [name]'s main support people   II. PSYCHOSOCIAL DATA A. Information Source  _x_Patient Interview  __Family Interview           _x_Other: chart  B. Event organiser __Employment: _x_Medicaid    [place]: [place]                __Private Insurance:                   __Self Pay  __Food Stamps   __WIC-plans to apply  __[employer]     __Public Housing     __Section 8    __Maternity Care Coordination/Child Service Coordination/Early Intervention  __School:                                                                         Grade:  __Other:   Priscille Kluver and Environment Information Cultural Issues Impacting Care: none known  III. STRENGTHS _x__Supportive  family/friends _x__Adequate Resources _x__Compliance with medical plan _x__Home prepared for Child (including basic supplies) _x__Understanding of illness      _x__Other: Pediatrician is [facility] IV. RISK FACTORS AND CURRENT PROBLEMS         ____No Problems Noted                                                                                                                                                                                                                                                Pt              Family         Substance Abuse-[name] hx of MJ                                          _x__              ___             Mental Illness-[name] hx of PPD  _x__              ___  Family/Relationship Issues                                      ___               ___             Abuse/Neglect/Domestic Violence                                         ___         ___  Financial Resources                                        ___              ___             Transportation                                                                        ___               ___  DSS Involvement                                                                   ___              ___  Adjustment to Illness                                                               ___              ___  Knowledge/Cognitive Deficit                                                   ___              ___             Compliance with Treatment                                                 ___  ___  Basic Needs (food, housing, etc.)                                          ___              ___             Housing Concerns                                       ___              ___ Other_____________________________________________________________            V. SOCIAL WORK ASSESSMENT SW met with [name] in her first floor room to complete assessment and evaluate current  emotional state.  [name] was quiet, but pleasant and states that she is feeling well at this point.  She denies any symptoms of depression at this time and states that she experienced PPD with both of her previous children.  She reports that symptoms were not bad, but she felt sad at times.  She has never taken medication for depression.  She does not feel she needs medication at this time.  SW explained that it is very normal to experience PPD and discussed signs and symptoms to watch for.  SW gave [name] "Feelings After Birth" handout and asked if she feels comfortable talking with her doctor if symptoms arise.  She states that she does.  [name] states that she lives alone with her sons, but she has joint custody with their father and she will have a few days to get settled at home with baby.  She states she and FOB are not together, but that he is involved and excited about baby, who is his first child.  She reports having good supports and has no needs at this time.  She says she has the main things for baby at home and plans to get the rest of the items today.  Her father was here with her, but he left when SW asked to talk with [name] privately.  SW asked [name] about her history of Marijuana use.  She denies use during pregnancy and states she has no plans to use again.  Specimens were not collected on baby since use was not during pregnancy.  [name] asked how to apply for [facility] and SW instructed her to call the main number, [phone].  VI. SOCIAL WORK PLAN  _x__No Further Intervention Required/No Barriers to Discharge   ___Psychosocial Support and Ongoing Assessment of Needs   ___Patient/Family Education:   ___Child Protective Services Report   County___________ Date___/____/____   ___Information/Referral to [employer]_________________________   ___Other:

## 2011-02-10 NOTE — Progress Notes (Signed)
Patient ID: Erin Archer, female   DOB: October 19, 1985, 26 y.o.   MRN: [id number] Post Partum Day 2 Subjective: no complaints, up ad lib without syncope, voiding, tolerating PO, + flatus  Pain well controlled with po meds bottlefeeding Mood stable, bonding well Decided not to have BTL, desires mirena   Objective: Blood pressure 100/64, pulse 83, temperature 98.3 F (36.8 C), temperature source Oral, resp. rate 18, height 5\' 5"  (1.651 m), weight 73.936 kg (163 lb), SpO2 98.00%, unknown if currently breastfeeding.  Physical Exam:  General: alert and no distress Lungs: CTAB Heart: RRR Breasts: wnl Lochia: appropriate Uterine Fundus: firm Perineum: healing DVT Evaluation: No evidence of DVT seen on physical exam. Negative Homan's sign. No significant calf/ankle edema.   Basename 02/09/11 0540 02/08/11 1745  HGB 9.0* 11.4*  HCT 25.4* 31.3*    Assessment/Plan: Discharge home, Social Work consult and Contraception mirena Anemia - hemodynamically stable       LOS: 2 days   Noal Abshier M 02/10/2011, 10:17 AM

## 2011-02-12 NOTE — Discharge Summary (Signed)
   Obstetric Discharge Summary Reason for Admission: onset of labor Prenatal Procedures: ultrasound Intrapartum Procedures: spontaneous vaginal delivery and GBS prophylaxis Postpartum Procedures: none Complications-Operative and Postpartum: none    Hemoglobin  Date Value Range Status  02/09/2011 9.0* 12.0-15.0 (g/dL) Final     DELTA CHECK NOTED     REPEATED TO VERIFY     HCT  Date Value Range Status  02/09/2011 25.4* 36.0-46.0 (%) Final    Hospital Course:  Hospital Course: Admitted in labor . pos GBS. tx'd with ampicillin,  Progressed to fully dilated, and delivered precipitiously . Delivery was performed by Manus Rudd, MD without difficulty.Eustace Pen, CNM was present immediately with delivery.  Patient and baby tolerated the procedure without difficulty, with no laceration noted. Infant to FTN. Mother and infant then had an uncomplicated postpartum course, with breast feeding going well. Mom's physical exam was WNL, and she was discharged home in stable condition. Contraception plan was mirena.  She received adequate benefit from po pain medications.  Discharge Diagnoses: Term Pregnancy-delivered  Discharge Information: Date: 02/12/2011 Activity: pelvic rest Diet: routine Medications:  Medication List  As of 02/12/2011  4:20 PM   START taking these medications         ibuprofen 600 MG tablet   Commonly known as: ADVIL,MOTRIN   Take 1 tablet (600 mg total) by mouth every 6 (six) hours.      iron polysaccharides 40-20 MG capsule   Commonly known as: NIFEREX 60   Take 1 capsule by mouth 2 (two) times daily with a meal.         CONTINUE taking these medications         guaiFENesin 600 MG 12 hr tablet   Commonly known as: MUCINEX      prenatal multivitamin Tabs         STOP taking these medications         nystatin-triamcinolone cream          Where to get your medications    These are the prescriptions that you need to pick up.   You may get these medications  from any pharmacy.         ibuprofen 600 MG tablet   iron polysaccharides 40-20 MG capsule           Condition: stable Instructions: refer to practice specific booklet Discharge to: home Follow-up Information    Follow up with Trevon Strothers M, CNM in 5 weeks. (If symptoms worsen)    Contact information:   [address] [place] [address] [phone]          Newborn Data: Live born  Information for the patient's newborn:  Wyline Copas [[id number]]  female ; APGAR , 9, 9 ; weight ; 7#5oz  Home with mother.  Christana Angelica M 02/12/2011, 4:20 PM

## 2011-02-13 ENCOUNTER — Encounter (HOSPITAL_COMMUNITY): Payer: Self-pay | Admitting: Obstetrics and Gynecology

## 2011-09-21 ENCOUNTER — Emergency Department (HOSPITAL_COMMUNITY): Payer: Self-pay

## 2011-09-21 ENCOUNTER — Emergency Department (HOSPITAL_COMMUNITY)
Admission: EM | Admit: 2011-09-21 | Discharge: 2011-09-21 | Disposition: A | Discharge status: Home / Self Care | Payer: Self-pay | Attending: Emergency Medicine | Admitting: Emergency Medicine

## 2011-09-21 ENCOUNTER — Encounter (HOSPITAL_COMMUNITY): Payer: Self-pay | Admitting: Emergency Medicine

## 2011-09-21 DIAGNOSIS — D696 Thrombocytopenia, unspecified: Secondary | ICD-10-CM | POA: Insufficient documentation

## 2011-09-21 DIAGNOSIS — N201 Calculus of ureter: Secondary | ICD-10-CM | POA: Insufficient documentation

## 2011-09-21 LAB — URINALYSIS, ROUTINE W REFLEX MICROSCOPIC
Bilirubin Urine: NEGATIVE
Specific Gravity, Urine: 1.02 (ref 1.005–1.030)
Urobilinogen, UA: 1 mg/dL (ref 0.0–1.0)
pH: 7 (ref 5.0–8.0)

## 2011-09-21 LAB — BASIC METABOLIC PANEL
BUN: 12 mg/dL (ref 6–23)
CO2: 26 mEq/L (ref 19–32)
Calcium: 9.2 mg/dL (ref 8.4–10.5)
GFR calc non Af Amer: >90 mL/min (ref >90)
Glucose, Bld: 96 mg/dL (ref 70–99)

## 2011-09-21 LAB — CBC WITH DIFFERENTIAL/PLATELET
Basophils Relative: 0 % (ref 0–1)
Eosinophils Absolute: 0.1 10*3/uL (ref 0.0–0.7)
HCT: 30.9 % — ABNORMAL LOW (ref 36.0–46.0)
Hemoglobin: 11.5 g/dL — ABNORMAL LOW (ref 12.0–15.0)
MCH: 23.3 pg — ABNORMAL LOW (ref 26.0–34.0)
MCHC: 37.2 g/dL — ABNORMAL HIGH (ref 30.0–36.0)
MCV: 62.6 fL — ABNORMAL LOW (ref 78.0–100.0)
Monocytes Absolute: 0.6 10*3/uL (ref 0.1–1.0)
Monocytes Relative: 10 % (ref 3–12)

## 2011-09-21 LAB — URINE MICROSCOPIC-ADD ON

## 2011-09-21 MED ORDER — OXYCODONE-ACETAMINOPHEN 5-325 MG PO TABS: 1.0000 | ORAL_TABLET | ORAL | Status: DC | PRN

## 2011-09-21 NOTE — ED Provider Notes (Signed)
History     CSN: [id number]  Arrival date & time 09/21/11  4098   First MD Initiated Contact with Patient 09/21/11 1008      Chief Complaint: Urinary frequency  (Consider location/radiation/quality/duration/timing/severity/associated sxs/prior treatment) Patient is a 26 y.o. female presenting with frequency. The history is provided by the patient.  Urinary Frequency  She has noted urinary frequency for the last week. Symptoms are moderate and have not changed significantly. On one occasion, she had some abdominal pain and nausea and vomiting, but has not had nausea vomiting or abdominal pain for the last 5 days. She denies any flank pain. She denies fever, chills, sweats. There is associated urinary tenesmus, but no urgency. She denies dysuria. She is 7 months postpartum and not breast feeding.  Past Medical History  Diagnosis Date  . Sickle cell trait   . Thrombocytopenia   . Urinary tract infection     Past Surgical History  Procedure Date  . Induced abortion     x2  . Tubal ligation 02/09/2011    Procedure: POST PARTUM TUBAL LIGATION;  Surgeon: Janine Limbo, MD;  Location: [facility];  Service: Gynecology;  Laterality: Bilateral;    Family History  Problem Relation Age of Onset  . Anesthesia problems Neg Hx     History  Substance Use Topics  . Smoking status: Current Every Day Smoker  . Smokeless tobacco: Never Used   Comment: with preg  . Alcohol Use: 0.6 oz/week    1 Shots of liquor per week     not with preg    OB History    Grav Para Term Preterm Abortions TAB SAB Ect Mult Living   5 3 3  2 2    3       Review of Systems  Genitourinary: Positive for frequency.  All other systems reviewed and are negative.    Allergies  Review of patient's allergies indicates no known allergies.  Home Medications   Current Outpatient Rx  Name Route Sig Dispense Refill  . B-12 PO Oral Take 1 tablet by mouth daily.      BP 134/83  Pulse 96  Temp 98.2 F (36.8  C) (Oral)  Resp 16  SpO2 100%  LMP 08/27/2011  Breastfeeding? No  Physical Exam  Nursing note and vitals reviewed. 26year old female, resting comfortably and in no acute distress. Vital signs are normal. Oxygen saturation is 100%, which is normal. Head is normocephalic and atraumatic. PERRLA, EOMI. Oropharynx is clear. Neck is nontender and supple without adenopathy or JVD. Back is nontender and there is no CVA tenderness. Lungs are clear without rales, wheezes, or rhonchi. Chest is nontender. Heart has regular rate and rhythm without murmur. Abdomen is soft, flat, nontender without masses or hepatosplenomegaly and peristalsis is normoactive. Extremities have no cyanosis or edema, full range of motion is present. Skin is warm and dry without rash. Neurologic: Mental status is normal, cranial nerves are intact, there are no motor or sensory deficits.   ED Course  Procedures (including critical care time)  Results for orders placed during the hospital encounter of 09/21/11  URINALYSIS, ROUTINE W REFLEX MICROSCOPIC      Component Value Range   Color, Urine YELLOW  YELLOW   APPearance CLEAR  CLEAR   Specific Gravity, Urine 1.020  1.005 - 1.030   pH 7.0  5.0 - 8.0   Glucose, UA NEGATIVE  NEGATIVE mg/dL   Hgb urine dipstick MODERATE (*) NEGATIVE   Bilirubin Urine NEGATIVE  NEGATIVE   Ketones, ur NEGATIVE  NEGATIVE mg/dL   Protein, ur NEGATIVE  NEGATIVE mg/dL   Urobilinogen, UA 1.0  0.0 - 1.0 mg/dL   Nitrite NEGATIVE  NEGATIVE   Leukocytes, UA NEGATIVE  NEGATIVE  URINE MICROSCOPIC-ADD ON      Component Value Range   Squamous Epithelial / LPF RARE  RARE   WBC, UA 0-2  <3 WBC/hpf   RBC / HPF 11-20  <3 RBC/hpf   Bacteria, UA RARE  RARE  CBC WITH DIFFERENTIAL      Component Value Range   WBC 6.0  4.0 - 10.5 K/uL   RBC 4.94  3.87 - 5.11 MIL/uL   Hemoglobin 11.5 (*) 12.0 - 15.0 g/dL   HCT 16.1 (*) 09.6 - 04.5 %   MCV 62.6 (*) 78.0 - 100.0 fL   MCH 23.3 (*) 26.0 - 34.0 pg    MCHC 37.2 (*) 30.0 - 36.0 g/dL   RDW 40.9 (*) 81.1 - 91.4 %   Platelets 124 (*) 150 - 400 K/uL   Neutrophils Relative 55  43 - 77 %   Neutro Abs 3.3  1.7 - 7.7 K/uL   Lymphocytes Relative 33  12 - 46 %   Lymphs Abs 2.0  0.7 - 4.0 K/uL   Monocytes Relative 10  3 - 12 %   Monocytes Absolute 0.6  0.1 - 1.0 K/uL   Eosinophils Relative 1  0 - 5 %   Eosinophils Absolute 0.1  0.0 - 0.7 K/uL   Basophils Relative 0  0 - 1 %   Basophils Absolute 0.0  0.0 - 0.1 K/uL   RBC Morphology TARGET CELLS    BASIC METABOLIC PANEL      Component Value Range   Sodium 135  135 - 145 mEq/L   Potassium 3.7  3.5 - 5.1 mEq/L   Chloride 102  96 - 112 mEq/L   CO2 26  19 - 32 mEq/L   Glucose, Bld 96  70 - 99 mg/dL   BUN 12  6 - 23 mg/dL   Creatinine, Ser 7.82  0.50 - 1.10 mg/dL   Calcium 9.2  8.4 - 95.6 mg/dL   GFR calc non Af Amer >90  >90 mL/min   GFR calc Af Amer >90  >90 mL/min   Ct Abdomen Pelvis Wo Contrast  09/21/2011  *RADIOLOGY REPORT*  Clinical Data: Hematuria.  Upper pelvic pressure. Difficulty urinating.  CT ABDOMEN AND PELVIS WITHOUT CONTRAST  Technique:  Multidetector CT imaging of the abdomen and pelvis was performed following the standard protocol without intravenous contrast.  Comparison: None.  Findings: There is a 4 mm calcification which appears to be in the left ureterovesicle junction. There is no left hydronephrosis.  The distal 1 cm of the left ureter is slightly dilated.  There are no renal calculi.  Liver, spleen, pancreas, adrenal glands, and right kidney are normal.  Appendix is normal.  Uterus is slightly deviated to the left but otherwise normal.  Ovaries are not enlarged.  No osseous abnormality.  IMPRESSION: 4 mm stone appears to be at the left ureterovesicle junction.  No hydronephrosis.   Original Report Authenticated By: Gwynn Burly, M.D.        1. Ureterolithiasis   2. Thrombocytopenia       MDM  Symptoms suggestive of UTI. Urinalysis will be obtained.  Urinalysis  is negative for UTI, but is significant for microscopic hematuria. Patient is not currently on her menses, so she needs further  evaluation for microscopic hematuria. CT has been ordered as well as renal function tests and CBC.  CT shows a distal ureteral calculus. This is most likely the source of her hematuria and urinary complaints. She is instructed to take over the counter NSAIDs and she is given a prescription for Percocet for more severe pain and she is referred to Dr. Brunilda Payor of [facility] for followup. Thrombocytopenia is mild and not clinically significant.    Dione Booze, MD 09/21/11 [id number]

## 2011-09-21 NOTE — ED Notes (Signed)
Pt reports for the past two to three weeks pt has been having difficulty urinating.  Pt states, "I feel like I gotta pee like it's right there but it's not. Sometimes it'll be a little bit of urine when it comes out."  Presently denies back pain, abdominal pain - but states she had RLQ abdominal pain about a week ago and had one episode of emesis.

## 2011-09-21 NOTE — ED Notes (Signed)
Reordered urinalysis, no results found for POC Pregnancy.

## 2011-09-22 LAB — URINE CULTURE

## 2011-11-15 ENCOUNTER — Emergency Department (HOSPITAL_COMMUNITY)
Admission: EM | Admit: 2011-11-15 | Discharge: 2011-11-15 | Disposition: A | Discharge status: Home / Self Care | Payer: Self-pay | Attending: Emergency Medicine | Admitting: Emergency Medicine

## 2011-11-15 ENCOUNTER — Encounter (HOSPITAL_COMMUNITY): Payer: Self-pay | Admitting: Emergency Medicine

## 2011-11-15 DIAGNOSIS — N760 Acute vaginitis: Secondary | ICD-10-CM | POA: Insufficient documentation

## 2011-11-15 DIAGNOSIS — R3915 Urgency of urination: Secondary | ICD-10-CM | POA: Insufficient documentation

## 2011-11-15 DIAGNOSIS — F17200 Nicotine dependence, unspecified, uncomplicated: Secondary | ICD-10-CM | POA: Insufficient documentation

## 2011-11-15 DIAGNOSIS — Z862 Personal history of diseases of the blood and blood-forming organs and certain disorders involving the immune mechanism: Secondary | ICD-10-CM | POA: Insufficient documentation

## 2011-11-15 DIAGNOSIS — D573 Sickle-cell trait: Secondary | ICD-10-CM | POA: Insufficient documentation

## 2011-11-15 DIAGNOSIS — Z87440 Personal history of urinary (tract) infections: Secondary | ICD-10-CM | POA: Insufficient documentation

## 2011-11-15 LAB — URINALYSIS, ROUTINE W REFLEX MICROSCOPIC
Bilirubin Urine: NEGATIVE
Glucose, UA: NEGATIVE mg/dL
Hgb urine dipstick: NEGATIVE
Specific Gravity, Urine: 1.029 (ref 1.005–1.030)
Urobilinogen, UA: 2 mg/dL — ABNORMAL HIGH (ref 0.0–1.0)

## 2011-11-15 LAB — WET PREP, GENITAL: Trich, Wet Prep: NONE SEEN

## 2011-11-15 MED ORDER — LIDOCAINE HCL 1 % IJ SOLN
INTRAMUSCULAR | Status: AC
  Administered 2011-11-15: 20 mL
  Filled 2011-11-15: qty 20

## 2011-11-15 MED ORDER — CEFTRIAXONE SODIUM 250 MG IJ SOLR
250.0000 mg | Freq: Once | INTRAMUSCULAR | Status: AC
  Administered 2011-11-15: 250 mg via INTRAMUSCULAR
  Filled 2011-11-15: qty 250

## 2011-11-15 MED ORDER — METRONIDAZOLE 500 MG PO TABS: 500.0000 mg | ORAL_TABLET | Freq: Two times a day (BID) | ORAL | Status: DC

## 2011-11-15 MED ORDER — GNP PRENATAL VITAMINS 28-0.8 MG PO TABS: 1.0000 | ORAL_TABLET | Freq: Once | ORAL | Status: DC

## 2011-11-15 NOTE — ED Notes (Signed)
Pelvic cart set up 

## 2011-11-15 NOTE — ED Provider Notes (Signed)
History     CSN: [id number]  Arrival date & time 11/15/11  1517   First MD Initiated Contact with Patient 11/15/11 1655      Chief Complaint  Patient presents with  . Dysuria    (Consider location/radiation/quality/duration/timing/severity/associated sxs/prior treatment) Patient is a 26 y.o. female presenting with frequency. The history is provided by the patient. No language interpreter was used.  Urinary Frequency This is a new problem. The current episode started more than 1 month ago. The problem occurs daily. The problem has been gradually worsening. Associated symptoms include urinary symptoms. Pertinent negatives include no abdominal pain, chills, fever, nausea or vomiting. She has tried nothing for the symptoms.  c/o urinary urgency x 2 months.  Denies vaginal discharge. Unprotected sex with 1 partner.  lmp 09/27/11.  No abdominal pain or bleeding. No GYN or pcp.  pmh listed below.     Past Medical History  Diagnosis Date  . Sickle cell trait   . Thrombocytopenia   . Urinary tract infection     Past Surgical History  Procedure Date  . Induced abortion     x2  . Tubal ligation 02/09/2011    Procedure: POST PARTUM TUBAL LIGATION;  Surgeon: Janine Limbo, MD;  Location: [facility];  Service: Gynecology;  Laterality: Bilateral;    Family History  Problem Relation Age of Onset  . Anesthesia problems Neg Hx     History  Substance Use Topics  . Smoking status: Current Every Day Smoker  . Smokeless tobacco: Never Used     Comment: with preg  . Alcohol Use: 0.6 oz/week    1 Shots of liquor per week     Comment: not with preg    OB History    Grav Para Term Preterm Abortions TAB SAB Ect Mult Living   5 3 3  2 2    3       Review of Systems  Constitutional: Negative for fever and chills.  Gastrointestinal: Negative for nausea, vomiting and abdominal pain.  Genitourinary: Positive for urgency. Negative for dysuria, frequency, hematuria, flank pain, decreased  urine volume, vaginal bleeding, vaginal discharge, difficulty urinating, vaginal pain, menstrual problem and pelvic pain.       "Feels like I have to go all the time"    Allergies  Review of patient's allergies indicates no known allergies.  Home Medications   Current Outpatient Rx  Name  Route  Sig  Dispense  Refill  . OXYCODONE-ACETAMINOPHEN 5-325 MG PO TABS   Oral   Take 1 tablet by mouth every 6 (six) hours as needed. For pain.           BP 124/63  Pulse 100  Temp 98.4 F (36.9 C) (Oral)  Resp 16  SpO2 99%  LMP 10/15/2011  Physical Exam  Nursing note and vitals reviewed. Constitutional: She is oriented to person, place, and time. She appears well-developed and well-nourished.  HENT:  Head: Normocephalic and atraumatic.  Eyes: Conjunctivae normal and EOM are normal. Pupils are equal, round, and reactive to light.  Neck: Normal range of motion. Neck supple.  Cardiovascular: Normal rate.   Pulmonary/Chest: Effort normal.  Abdominal: Soft.  Genitourinary: Uterus normal. There is no tenderness or lesion on the right labia. There is no tenderness or lesion on the left labia. Cervix exhibits discharge. Cervix exhibits no motion tenderness and no friability. Right adnexum displays no mass, no tenderness and no fullness. Left adnexum displays no mass, no tenderness and no fullness. No  erythema, tenderness or bleeding around the vagina. No foreign body around the vagina. Vaginal discharge found.  Musculoskeletal: Normal range of motion. She exhibits no edema and no tenderness.  Neurological: She is alert and oriented to person, place, and time. She has normal reflexes.  Skin: Skin is warm and dry.  Psychiatric: She has a normal mood and affect.    ED Course  Pelvic exam Date/Time: 11/15/2011 8:54 AM Performed by: Remi Haggard Authorized by: Remi Haggard Consent: Verbal consent obtained. Risks and benefits: risks, benefits and alternatives were discussed Consent given  by: patient Patient understanding: patient states understanding of the procedure being performed Patient identity confirmed: verbally with patient, arm band, provided demographic data and hospital-assigned identification number Time out: Immediately prior to procedure a "time out" was called to verify the correct patient, procedure, equipment, support staff and site/side marked as required. Preparation: Patient was prepped and draped in the usual sterile fashion. Local anesthesia used: no Patient sedated: no Patient tolerance: Patient tolerated the procedure well with no immediate complications.   (including critical care time)  Labs Reviewed  URINALYSIS, ROUTINE W REFLEX MICROSCOPIC - Abnormal; Notable for the following:    Urobilinogen, UA 2.0 (*)     All other components within normal limits  POCT PREGNANCY, URINE - Abnormal; Notable for the following:    Preg Test, Ur POSITIVE (*)     All other components within normal limits  WET PREP, GENITAL - Abnormal; Notable for the following:    Clue Cells Wet Prep HPF POC MODERATE (*)     WBC, Wet Prep HPF POC MODERATE (*)     All other components within normal limits  URINE CULTURE  GC/CHLAMYDIA PROBE AMP   No results found.   No diagnosis found.    MDM  Pregnant with LMP 09/27/11.  Urinary urgency x 2 months with unprotected sex. No pain or bleeding.  Vaginal discharge with + BV and WBC moderate.  Rocephen 250IM and rx for flagy and prenatal vitamins.  Follow up within 1 week to OBGYN.  Return to ER for abdominal pain or bleeding.    Labs Reviewed  URINALYSIS, ROUTINE W REFLEX MICROSCOPIC - Abnormal; Notable for the following:    Urobilinogen, UA 2.0 (*)     All other components within normal limits  POCT PREGNANCY, URINE - Abnormal; Notable for the following:    Preg Test, Ur POSITIVE (*)     All other components within normal limits  WET PREP, GENITAL - Abnormal; Notable for the following:    Clue Cells Wet Prep HPF POC  MODERATE (*)     WBC, Wet Prep HPF POC MODERATE (*)     All other components within normal limits  URINE CULTURE  GC/CHLAMYDIA PROBE AMP         Remi Haggard, NP 11/16/11 1204

## 2011-11-15 NOTE — ED Notes (Signed)
Pt states she has had frequency of urination for 2 months.  States that when she uses the bathroom, she doesn't feel like she is finished.  States that she was seen at [facility] 2 months ago and was told that she had kidney stones.  States that she has not passed them.

## 2011-11-17 LAB — URINE CULTURE
Colony Count: NO GROWTH
Culture: NO GROWTH

## 2011-11-17 NOTE — ED Provider Notes (Signed)
Medical screening examination/treatment/procedure(s) were performed by non-physician practitioner and as supervising physician I was immediately available for consultation/collaboration.   Madina Galati E Safwan Tomei, MD 11/17/11 1246 

## 2012-08-03 ENCOUNTER — Emergency Department (HOSPITAL_COMMUNITY): Payer: Medicaid Other

## 2012-08-03 ENCOUNTER — Encounter (HOSPITAL_COMMUNITY): Payer: Self-pay | Admitting: Emergency Medicine

## 2012-08-03 ENCOUNTER — Emergency Department (HOSPITAL_COMMUNITY)
Admission: EM | Admit: 2012-08-03 | Discharge: 2012-08-03 | Disposition: A | Discharge status: Home / Self Care | Payer: Medicaid Other | Attending: Emergency Medicine | Admitting: Emergency Medicine

## 2012-08-03 DIAGNOSIS — R059 Cough, unspecified: Secondary | ICD-10-CM | POA: Insufficient documentation

## 2012-08-03 DIAGNOSIS — R5383 Other fatigue: Secondary | ICD-10-CM | POA: Insufficient documentation

## 2012-08-03 DIAGNOSIS — Z3490 Encounter for supervision of normal pregnancy, unspecified, unspecified trimester: Secondary | ICD-10-CM

## 2012-08-03 DIAGNOSIS — O99330 Smoking (tobacco) complicating pregnancy, unspecified trimester: Secondary | ICD-10-CM | POA: Insufficient documentation

## 2012-08-03 DIAGNOSIS — R05 Cough: Secondary | ICD-10-CM | POA: Insufficient documentation

## 2012-08-03 DIAGNOSIS — F17200 Nicotine dependence, unspecified, uncomplicated: Secondary | ICD-10-CM | POA: Insufficient documentation

## 2012-08-03 DIAGNOSIS — N760 Acute vaginitis: Secondary | ICD-10-CM | POA: Insufficient documentation

## 2012-08-03 DIAGNOSIS — J3489 Other specified disorders of nose and nasal sinuses: Secondary | ICD-10-CM | POA: Insufficient documentation

## 2012-08-03 DIAGNOSIS — Z79899 Other long term (current) drug therapy: Secondary | ICD-10-CM | POA: Insufficient documentation

## 2012-08-03 DIAGNOSIS — R638 Other symptoms and signs concerning food and fluid intake: Secondary | ICD-10-CM | POA: Insufficient documentation

## 2012-08-03 DIAGNOSIS — Z87440 Personal history of urinary (tract) infections: Secondary | ICD-10-CM | POA: Insufficient documentation

## 2012-08-03 DIAGNOSIS — O9989 Other specified diseases and conditions complicating pregnancy, childbirth and the puerperium: Secondary | ICD-10-CM | POA: Insufficient documentation

## 2012-08-03 DIAGNOSIS — R109 Unspecified abdominal pain: Secondary | ICD-10-CM

## 2012-08-03 DIAGNOSIS — R509 Fever, unspecified: Secondary | ICD-10-CM | POA: Insufficient documentation

## 2012-08-03 DIAGNOSIS — R112 Nausea with vomiting, unspecified: Secondary | ICD-10-CM | POA: Insufficient documentation

## 2012-08-03 DIAGNOSIS — M6281 Muscle weakness (generalized): Secondary | ICD-10-CM | POA: Insufficient documentation

## 2012-08-03 DIAGNOSIS — O218 Other vomiting complicating pregnancy: Secondary | ICD-10-CM | POA: Insufficient documentation

## 2012-08-03 DIAGNOSIS — Z862 Personal history of diseases of the blood and blood-forming organs and certain disorders involving the immune mechanism: Secondary | ICD-10-CM | POA: Insufficient documentation

## 2012-08-03 DIAGNOSIS — R1013 Epigastric pain: Secondary | ICD-10-CM | POA: Insufficient documentation

## 2012-08-03 DIAGNOSIS — R5381 Other malaise: Secondary | ICD-10-CM | POA: Insufficient documentation

## 2012-08-03 LAB — COMPREHENSIVE METABOLIC PANEL
ALT: 8 U/L (ref 0–35)
AST: 15 U/L (ref 0–37)
Albumin: 3.5 g/dL (ref 3.5–5.2)
Alkaline Phosphatase: 67 U/L (ref 39–117)
Potassium: 3.3 mEq/L — ABNORMAL LOW (ref 3.5–5.1)
Sodium: 129 mEq/L — ABNORMAL LOW (ref 135–145)
Total Protein: 7.4 g/dL (ref 6.0–8.3)

## 2012-08-03 LAB — CBC WITH DIFFERENTIAL/PLATELET
Basophils Absolute: 0 10*3/uL (ref 0.0–0.1)
Basophils Relative: 0 % (ref 0–1)
Eosinophils Absolute: 0 10*3/uL (ref 0.0–0.7)
Hemoglobin: 10.4 g/dL — ABNORMAL LOW (ref 12.0–15.0)
Lymphocytes Relative: 22 % (ref 12–46)
MCHC: 37 g/dL — ABNORMAL HIGH (ref 30.0–36.0)
Neutrophils Relative %: 58 % (ref 43–77)

## 2012-08-03 LAB — PREGNANCY, URINE: Preg Test, Ur: POSITIVE — AB

## 2012-08-03 LAB — WET PREP, GENITAL
Trich, Wet Prep: NONE SEEN
Yeast Wet Prep HPF POC: NONE SEEN

## 2012-08-03 LAB — URINALYSIS, ROUTINE W REFLEX MICROSCOPIC
Glucose, UA: NEGATIVE mg/dL
Leukocytes, UA: NEGATIVE
Protein, ur: 30 mg/dL — AB
Specific Gravity, Urine: 1.034 — ABNORMAL HIGH (ref 1.005–1.030)
pH: 6.5 (ref 5.0–8.0)

## 2012-08-03 MED ORDER — ONDANSETRON HCL 4 MG PO TABS: 4.0000 mg | ORAL_TABLET | Freq: Four times a day (QID) | ORAL | Status: DC

## 2012-08-03 MED ORDER — ALUM & MAG HYDROXIDE-SIMETH 200-200-20 MG/5ML PO SUSP
30.0000 mL | Freq: Once | ORAL | Status: AC
  Administered 2012-08-03: 60 mL via ORAL
  Filled 2012-08-03: qty 30

## 2012-08-03 MED ORDER — METRONIDAZOLE 500 MG PO TABS: 500.0000 mg | ORAL_TABLET | Freq: Two times a day (BID) | ORAL | Status: DC

## 2012-08-03 MED ORDER — PRENATAL COMPLETE 14-0.4 MG PO TABS: 1.0000 | ORAL_TABLET | Freq: Every day | ORAL | Status: DC

## 2012-08-03 MED ORDER — ACETAMINOPHEN 325 MG PO TABS
ORAL_TABLET | ORAL | Status: AC
  Administered 2012-08-03: 650 mg
  Filled 2012-08-03: qty 2

## 2012-08-03 MED ORDER — GI COCKTAIL ~~LOC~~
30.0000 mL | Freq: Once | ORAL | Status: AC
  Administered 2012-08-03: 30 mL via ORAL
  Filled 2012-08-03: qty 30

## 2012-08-03 MED ORDER — ONDANSETRON HCL 4 MG/2ML IJ SOLN
4.0000 mg | Freq: Once | INTRAMUSCULAR | Status: DC
  Filled 2012-08-03: qty 2

## 2012-08-03 MED ORDER — SODIUM CHLORIDE 0.9 % IV BOLUS (SEPSIS)
1000.0000 mL | Freq: Once | INTRAVENOUS | Status: AC
  Administered 2012-08-03: 1000 mL via INTRAVENOUS

## 2012-08-03 NOTE — ED Notes (Signed)
Pt states she is pregnant, states she has not had test but knows she is, c/o upper abdominal pain, chills, chest hurting, nausea and vomiting, fever up to 103 last night, took nyquil and dayquil

## 2012-08-03 NOTE — ED Notes (Signed)
Pt asking "why do we need to run so many tests for my pregnancy, I already know I am pregnant, I did not come here for that". Pt also states "I really do not care how far along I am"

## 2012-08-03 NOTE — ED Provider Notes (Signed)
Patient presents to the ER for evaluation of upper abdominal pain. Patient reports that she has had a fever with this as well. Her workup was unremarkable. Patient was signed out to me with ultrasound pending. Ultrasound is performed to rule out cholecystitis. Patient has a small amount of sludge, but does not have any evidence of cholecystitis. I did examine her once again. Minimal tenderness in the epigastric and right upper quadrant area, no Murphy sign.  Patient has spiked a fever. Fever was 102 here in the ER. She reports fever of 103 last night. Source of fever is unclear. Urine was unremarkable. Lungs are clear, no cough. Patient treated with Tylenol for pain and fever. She'll continue to use Tylenol as needed at home. Followup with OB/GYN for the pregnancy. Return to the ER if her symptoms worsen.  Gilda Crease, MD 08/03/12 1911

## 2012-08-03 NOTE — ED Notes (Signed)
Pt going to ultrasound at this time. Will do pelvic procedure when patient gets back

## 2012-08-03 NOTE — ED Provider Notes (Signed)
CSN: [id number]     Arrival date & time 08/03/12  1221 History     First MD Initiated Contact with Patient 08/03/12 1233     Chief Complaint  Patient presents with  . Fever  . Abdominal Pain   (Consider location/radiation/quality/duration/timing/severity/associated sxs/prior Treatment) HPI Comments: Patient presents with a one-week history of cough, congestion, chills, nausea. She states she had a fever of 103 last night. She believes that she is pregnant and her last initial period was last month. She complains of epigastric pain and nausea. She denies any diarrhea, dysuria or hematuria. Denies any sick contacts. Denies any recent travel. No vaginal bleeding or discharge. Nothing makes the pain better or worse.  The history is provided by the patient.    Past Medical History  Diagnosis Date  . Sickle cell trait   . Thrombocytopenia   . Urinary tract infection    Past Surgical History  Procedure Laterality Date  . Induced abortion      x2  . Tubal ligation  02/09/2011    Procedure: POST PARTUM TUBAL LIGATION;  Surgeon: Janine Limbo, MD;  Location: [facility];  Service: Gynecology;  Laterality: Bilateral;   Family History  Problem Relation Age of Onset  . Anesthesia problems Neg Hx    History  Substance Use Topics  . Smoking status: Current Every Day Smoker -- 0.50 packs/day  . Smokeless tobacco: Never Used     Comment: with preg  . Alcohol Use: 0.6 oz/week    1 Shots of liquor per week     Comment: not with preg   OB History   Grav Para Term Preterm Abortions TAB SAB Ect Mult Living   5 3 3  2 2    3      Review of Systems  Constitutional: Positive for fever, chills, activity change, appetite change and fatigue.  HENT: Positive for congestion and rhinorrhea.   Respiratory: Negative for choking, chest tightness and shortness of breath.   Cardiovascular: Negative for chest pain.  Gastrointestinal: Positive for nausea, vomiting and abdominal pain. Negative for diarrhea.   Genitourinary: Negative for dysuria, vaginal bleeding and vaginal discharge.  Musculoskeletal: Negative for back pain.  Skin: Negative for rash.  Neurological: Positive for weakness. Negative for headaches.  A complete 10 system review of systems was obtained and all systems are negative except as noted in the HPI and PMH.    Allergies  Review of patient's allergies indicates no known allergies.  Home Medications   Current Outpatient Rx  Name  Route  Sig  Dispense  Refill  . DM-Doxylamine-Acetaminophen (NYQUIL COLD & FLU PO)   Oral   Take 2 capsules by mouth at bedtime as needed (cough).         . Pseudoephedrine-APAP-DM (DAYQUIL MULTI-SYMPTOM COLD/FLU PO)   Oral   Take 2 capsules by mouth every 4 (four) hours as needed (cough).         . metroNIDAZOLE (FLAGYL) 500 MG tablet   Oral   Take 1 tablet (500 mg total) by mouth 2 (two) times daily.   14 tablet   0   . ondansetron (ZOFRAN) 4 MG tablet   Oral   Take 1 tablet (4 mg total) by mouth every 6 (six) hours.   12 tablet   0   . Prenatal Vit-Fe Fumarate-FA (PRENATAL COMPLETE) 14-0.4 MG TABS   Oral   Take 1 tablet by mouth daily.   60 each   0    BP 131/81  Pulse 97  Temp(Src) 98.6 F (37 C) (Oral)  Resp 18  SpO2 100% Physical Exam  Constitutional: She is oriented to person, place, and time. She appears well-developed and well-nourished. No distress.  HENT:  Head: Normocephalic and atraumatic.  Mouth/Throat: Oropharynx is clear and moist. No oropharyngeal exudate.  Upper airway congestion, rhinorrhea.  Eyes: Conjunctivae and EOM are normal. Pupils are equal, round, and reactive to light.  Neck: Normal range of motion. Neck supple.  Cardiovascular: Normal rate, regular rhythm and normal heart sounds.   No murmur heard. Pulmonary/Chest: Effort normal and breath sounds normal. No respiratory distress.  Abdominal: Soft. There is tenderness. There is no rebound and no guarding.  Epigastric tenderness   Genitourinary: Cervix exhibits no motion tenderness. Right adnexum displays no mass and no tenderness. Left adnexum displays no mass and no tenderness. No vaginal discharge found.  No CMT, no adnexal tenderness  Musculoskeletal: Normal range of motion. She exhibits no edema and no tenderness.  Neurological: She is alert and oriented to person, place, and time. No cranial nerve deficit. She exhibits normal muscle tone. Coordination normal.  Skin: Skin is warm.    ED Course   Procedures (including critical care time)  Labs Reviewed  WET PREP, GENITAL - Abnormal; Notable for the following:    Clue Cells Wet Prep HPF POC FEW (*)    WBC, Wet Prep HPF POC FEW (*)    All other components within normal limits  URINALYSIS, ROUTINE W REFLEX MICROSCOPIC - Abnormal; Notable for the following:    Color, Urine AMBER (*)    APPearance CLOUDY (*)    Specific Gravity, Urine 1.034 (*)    Bilirubin Urine SMALL (*)    Protein, ur 30 (*)    Urobilinogen, UA 4.0 (*)    All other components within normal limits  PREGNANCY, URINE - Abnormal; Notable for the following:    Preg Test, Ur POSITIVE (*)    All other components within normal limits  CBC WITH DIFFERENTIAL - Abnormal; Notable for the following:    Hemoglobin 10.4 (*)    HCT 28.1 (*)    MCV 61.2 (*)    MCH 22.7 (*)    MCHC 37.0 (*)    RDW 17.3 (*)    Platelets 102 (*)    Monocytes Relative 20 (*)    Monocytes Absolute 1.2 (*)    All other components within normal limits  COMPREHENSIVE METABOLIC PANEL - Abnormal; Notable for the following:    Sodium 129 (*)    Potassium 3.3 (*)    Chloride 95 (*)    All other components within normal limits  HCG, QUANTITATIVE, PREGNANCY - Abnormal; Notable for the following:    hCG, Beta Chain, Quant, [place] [address] (*)    All other components within normal limits  URINE MICROSCOPIC-ADD ON - Abnormal; Notable for the following:    Bacteria, UA FEW (*)    All other components within normal limits   GC/CHLAMYDIA PROBE AMP  LIPASE, BLOOD  ABO/RH   US Ob Comp Less 14 Wks  08/03/2012   *RADIOLOGY REPORT*  Clinical Data: Fever, abdominal pain  TRANSVAGINAL OBSTETRIC US  Technique:  Transvaginal ultrasound was performed for complete evaluation of the gestation as well as the maternal uterus, adnexal regions, and pelvic cul-de-sac.  Comparison:  CT abdomen/pelvis 09/21/2011  Intrauterine gestational sac: A single intrauterine gestational sac is identified. Yolk sac: Present Embryo: Present Cardiac Activity: Yes Heart Rate: 153 beats per minute  CRL: 10.2 mm  mm  7    w 1    d  [place] EDC: March 21, 2013  Maternal uterus/adnexae: No subchorionic hemorrhage identified.  The right ovary is sonographically unremarkable and measures 2.5 x 1.4 by 2.4 cm.  The left ovary measures 3.8 x 1.4 by 3.3 cm.  There is a relatively hypoechoic solid 2.4 x 1.5 x 2.4 cm structure within the left ovary which is nonspecific and may represents a hemorrhagic corpus luteum.  IMPRESSION:  Single viable intrauterine gestation as above.  The fetal heart rate is 153 beats per minute.  Nonspecific hypoechoic appearing structure within the left ovary is nonspecific and may represent a complex/hemorrhagic corpus luteum. Recommend attention on routine follow-up imaging.   Original Report Authenticated By: Malachy Moan, M.D.   US Ob Transvaginal  08/03/2012   *RADIOLOGY REPORT*  Clinical Data: Fever, abdominal pain  TRANSVAGINAL OBSTETRIC US  Technique:  Transvaginal ultrasound was performed for complete evaluation of the gestation as well as the maternal uterus, adnexal regions, and pelvic cul-de-sac.  Comparison:  CT abdomen/pelvis 09/21/2011  Intrauterine gestational sac: A single intrauterine gestational sac is identified. Yolk sac: Present Embryo: Present Cardiac Activity: Yes Heart Rate: 153 beats per minute  CRL: 10.2 mm  mm  7    w 1    d  [place] EDC: March 21, 2013  Maternal uterus/adnexae: No subchorionic hemorrhage identified.  The  right ovary is sonographically unremarkable and measures 2.5 x 1.4 by 2.4 cm.  The left ovary measures 3.8 x 1.4 by 3.3 cm.  There is a relatively hypoechoic solid 2.4 x 1.5 x 2.4 cm structure within the left ovary which is nonspecific and may represents a hemorrhagic corpus luteum.  IMPRESSION:  Single viable intrauterine gestation as above.  The fetal heart rate is 153 beats per minute.  Nonspecific hypoechoic appearing structure within the left ovary is nonspecific and may represent a complex/hemorrhagic corpus luteum. Recommend attention on routine follow-up imaging.   Original Report Authenticated By: Malachy Moan, M.D.   1. Intrauterine pregnancy   2. Abdominal pain   3. Bacterial vaginosis     MDM  Epigastric pain with cough, congestion. Vital signs stable. Afebrile. Pregnancy test is positive.  Pregnancy test is positive.  Patient states she never had tubal ligation contrary to her chart's listed history. Pelvic exam is benign. IUP confirmed on ultrasound. There is a hypoechoic structure in the left ovary likely corpus luteum cyst. [place] results d/w Dr. Marice Potter who doubts heterotopic pregnancy.  LFTs and lipase normal.  Suspect patient's epigastric pain 2/2 GERD. RUQ [place] pending at time of sign out to Dr. Blinda Leatherwood. Will treat BV.  Glynn Octave, MD 08/03/12 [phone]

## 2012-08-04 ENCOUNTER — Other Ambulatory Visit (HOSPITAL_COMMUNITY): Payer: Self-pay | Admitting: Emergency Medicine

## 2012-08-05 LAB — GC/CHLAMYDIA PROBE AMP: CT Probe RNA: NEGATIVE

## 2012-08-07 ENCOUNTER — Emergency Department (HOSPITAL_COMMUNITY)
Admission: EM | Admit: 2012-08-07 | Discharge: 2012-08-07 | Disposition: A | Discharge status: Home / Self Care | Payer: Medicaid Other | Attending: Emergency Medicine | Admitting: Emergency Medicine

## 2012-08-07 ENCOUNTER — Encounter (HOSPITAL_COMMUNITY): Payer: Self-pay | Admitting: Emergency Medicine

## 2012-08-07 DIAGNOSIS — Z87440 Personal history of urinary (tract) infections: Secondary | ICD-10-CM | POA: Insufficient documentation

## 2012-08-07 DIAGNOSIS — Z862 Personal history of diseases of the blood and blood-forming organs and certain disorders involving the immune mechanism: Secondary | ICD-10-CM | POA: Insufficient documentation

## 2012-08-07 DIAGNOSIS — IMO0002 Reserved for concepts with insufficient information to code with codable children: Secondary | ICD-10-CM | POA: Insufficient documentation

## 2012-08-07 DIAGNOSIS — O9989 Other specified diseases and conditions complicating pregnancy, childbirth and the puerperium: Secondary | ICD-10-CM | POA: Insufficient documentation

## 2012-08-07 DIAGNOSIS — O99330 Smoking (tobacco) complicating pregnancy, unspecified trimester: Secondary | ICD-10-CM | POA: Insufficient documentation

## 2012-08-07 DIAGNOSIS — Z79899 Other long term (current) drug therapy: Secondary | ICD-10-CM | POA: Insufficient documentation

## 2012-08-07 DIAGNOSIS — R509 Fever, unspecified: Secondary | ICD-10-CM | POA: Insufficient documentation

## 2012-08-07 DIAGNOSIS — L0291 Cutaneous abscess, unspecified: Secondary | ICD-10-CM

## 2012-08-07 MED ORDER — ACETAMINOPHEN 500 MG PO TABS
1000.0000 mg | ORAL_TABLET | Freq: Once | ORAL | Status: DC
  Filled 2012-08-07: qty 2

## 2012-08-07 MED ORDER — CLINDAMYCIN HCL 150 MG PO CAPS: 300.0000 mg | ORAL_CAPSULE | Freq: Four times a day (QID) | ORAL | Status: DC

## 2012-08-07 NOTE — ED Provider Notes (Signed)
CSN: [id number]     Arrival date & time 08/07/12  1213 History     First MD Initiated Contact with Patient 08/07/12 1303     Chief Complaint  Patient presents with  . Recurrent Skin Infections  . Fever   (Consider location/radiation/quality/duration/timing/severity/associated sxs/prior Treatment) HPI 27 yo presents with 3 day history of boil in axilla bilaterally, L > R. She states they are very tender. She has had this problem in the past, typically during prior pregnancies. She states she is 7-[redacted] weeks pregnant at this time. She tried putting fatback on them and noticed that they came to the surface.  She notes subjective fever. She denies N/V/D, boils/skin infections outside of axilla.   Past Medical History  Diagnosis Date  . Sickle cell trait   . Thrombocytopenia   . Urinary tract infection    Past Surgical History  Procedure Laterality Date  . Induced abortion      x2  . Tubal ligation  02/09/2011    Procedure: POST PARTUM TUBAL LIGATION;  Surgeon: Janine Limbo, MD;  Location: [facility];  Service: Gynecology;  Laterality: Bilateral;   Family History  Problem Relation Age of Onset  . Anesthesia problems Neg Hx    History  Substance Use Topics  . Smoking status: Current Every Day Smoker -- 0.50 packs/day  . Smokeless tobacco: Never Used     Comment: with preg  . Alcohol Use: 0.6 oz/week    1 Shots of liquor per week     Comment: not with preg   OB History   Grav Para Term Preterm Abortions TAB SAB Ect Mult Living   5 3 3  2 2    3      Review of Systems Ten systems reviewed and are negative for acute change, except as noted in the HPI.   Allergies  Review of patient's allergies indicates no known allergies.  Home Medications   Current Outpatient Rx  Name  Route  Sig  Dispense  Refill  . DM-Doxylamine-Acetaminophen (NYQUIL COLD & FLU PO)   Oral   Take 2 capsules by mouth at bedtime as needed (cough).         . Pseudoephedrine-APAP-DM (DAYQUIL  MULTI-SYMPTOM COLD/FLU PO)   Oral   Take 2 capsules by mouth every 4 (four) hours as needed (cough).         . metroNIDAZOLE (FLAGYL) 500 MG tablet   Oral   Take 1 tablet (500 mg total) by mouth 2 (two) times daily.   14 tablet   0   . ondansetron (ZOFRAN) 4 MG tablet   Oral   Take 1 tablet (4 mg total) by mouth every 6 (six) hours.   12 tablet   0   . Prenatal Vit-Fe Fumarate-FA (PRENATAL COMPLETE) 14-0.4 MG TABS   Oral   Take 1 tablet by mouth daily.   60 each   0    BP 121/74  Pulse 101  Temp(Src) 99.2 F (37.3 C) (Oral)  Resp 17  SpO2 100%  LMP 07/12/2012 Physical Exam Physical Exam  Nursing note and vitals reviewed. Constitutional: She is oriented to person, place, and time. She appears well-developed and well-nourished. No distress.  HENT:  Head: Normocephalic and atraumatic.  Eyes: Conjunctivae normal and EOM are normal. Pupils are equal, round, and reactive to light. No scleral icterus.  Neck: Normal range of motion.  Cardiovascular: Normal rate, regular rhythm and normal heart sounds.  Exam reveals no gallop and no friction rub.  No murmur heard. Pulmonary/Chest: Effort normal and breath sounds normal. No respiratory distress.  Abdominal: Soft. Bowel sounds are normal. She exhibits no distension and no mass. There is no tenderness. There is no guarding.  Neurological: She is alert and oriented to person, place, and time.  Skin: Skin is warm and dry. She is not diaphoretic.  3 x 2 cm abcess L axilla.   ED Course   Procedures (including critical care time)  INCISION AND DRAINAGE Performed by: Arthor Captain Consent: Verbal consent obtained. Risks and benefits: risks, benefits and alternatives were discussed Type: abscess  Body area: Axilla  Anesthesia: local infiltration  Incision was made with a scalpel.  Local anesthetic: lidocaine 2 % w/o epinephrine  Anesthetic total: 3 ml  Complexity: complex Blunt dissection to break up  loculations  Drainage: blood  Drainage amount: minimal  Packing material: 1/4 in iodoform gauze  Patient tolerance: Patient tolerated the procedure well with no immediate complications.    Labs Reviewed - No data to display No results found. 1. Cellulitis and abscess     MDM  Patient with skin abscess amenable to incision and drainage.No purulent drainage obtained.wound recheck in 2 days. Will d/c home with abx for cellulitis.   Arthor Captain, PA-C 08/10/12 2200

## 2012-08-07 NOTE — Progress Notes (Signed)
[facility] provided patient with a [facility] [employer] Card application.  °

## 2012-08-07 NOTE — ED Notes (Signed)
Pt states that she started having pain under both arms on Sunday then on Monday pt had boil under both arm. Pt states she is about [redacted] weeks pregnant and she also c/o pain in her buttocks. Pt states she also has fever.

## 2012-08-11 NOTE — ED Provider Notes (Signed)
History/physical exam/procedure(s) were performed by non-physician practitioner and as supervising physician I was immediately available for consultation/collaboration. I have reviewed all notes and am in agreement with care and plan.   Marabelle Cushman S Elana Jian, MD 08/11/12 1505 

## 2013-04-12 ENCOUNTER — Encounter (HOSPITAL_COMMUNITY): Payer: Self-pay | Admitting: Emergency Medicine

## 2013-04-12 ENCOUNTER — Observation Stay (HOSPITAL_COMMUNITY): Payer: Medicaid Other

## 2013-04-12 ENCOUNTER — Observation Stay (HOSPITAL_COMMUNITY)
Admission: EM | Admit: 2013-04-12 | Discharge: 2013-04-13 | Disposition: A | Discharge status: Home / Self Care | Payer: Medicaid Other | Attending: Internal Medicine | Admitting: Internal Medicine

## 2013-04-12 DIAGNOSIS — E872 Acidosis, unspecified: Secondary | ICD-10-CM

## 2013-04-12 DIAGNOSIS — D72829 Elevated white blood cell count, unspecified: Secondary | ICD-10-CM | POA: Insufficient documentation

## 2013-04-12 DIAGNOSIS — F1210 Cannabis abuse, uncomplicated: Secondary | ICD-10-CM | POA: Insufficient documentation

## 2013-04-12 DIAGNOSIS — D5740 Sickle-cell thalassemia without crisis: Secondary | ICD-10-CM | POA: Insufficient documentation

## 2013-04-12 DIAGNOSIS — E860 Dehydration: Principal | ICD-10-CM

## 2013-04-12 DIAGNOSIS — R112 Nausea with vomiting, unspecified: Secondary | ICD-10-CM

## 2013-04-12 DIAGNOSIS — D573 Sickle-cell trait: Secondary | ICD-10-CM

## 2013-04-12 DIAGNOSIS — F17200 Nicotine dependence, unspecified, uncomplicated: Secondary | ICD-10-CM | POA: Insufficient documentation

## 2013-04-12 DIAGNOSIS — R197 Diarrhea, unspecified: Secondary | ICD-10-CM

## 2013-04-12 DIAGNOSIS — K529 Noninfective gastroenteritis and colitis, unspecified: Secondary | ICD-10-CM | POA: Diagnosis present

## 2013-04-12 DIAGNOSIS — K5289 Other specified noninfective gastroenteritis and colitis: Secondary | ICD-10-CM | POA: Insufficient documentation

## 2013-04-12 DIAGNOSIS — D696 Thrombocytopenia, unspecified: Secondary | ICD-10-CM | POA: Insufficient documentation

## 2013-04-12 DIAGNOSIS — O99019 Anemia complicating pregnancy, unspecified trimester: Secondary | ICD-10-CM

## 2013-04-12 HISTORY — DX: Thalassemia, unspecified: D56.9

## 2013-04-12 LAB — I-STAT CHEM 8, ED
BUN: 11 mg/dL (ref 6–23)
CALCIUM ION: 1.11 mmol/L — AB (ref 1.12–1.23)
CREATININE: 0.5 mg/dL (ref 0.50–1.10)
Chloride: 108 mEq/L (ref 96–112)
GLUCOSE: 125 mg/dL — AB (ref 70–99)
HCT: 35 % — ABNORMAL LOW (ref 36.0–46.0)
HEMOGLOBIN: 11.9 g/dL — AB (ref 12.0–15.0)
POTASSIUM: 3.9 meq/L (ref 3.7–5.3)
Sodium: 141 mEq/L (ref 137–147)
TCO2: 22 mmol/L (ref 0–100)

## 2013-04-12 LAB — COMPREHENSIVE METABOLIC PANEL
ALBUMIN: 3.5 g/dL (ref 3.5–5.2)
ALT: 17 U/L (ref 0–35)
AST: 19 U/L (ref 0–37)
Alkaline Phosphatase: 52 U/L (ref 39–117)
BUN: 9 mg/dL (ref 6–23)
CALCIUM: 8.4 mg/dL (ref 8.4–10.5)
CO2: 22 mEq/L (ref 19–32)
Chloride: 108 mEq/L (ref 96–112)
Creatinine, Ser: 0.48 mg/dL — ABNORMAL LOW (ref 0.50–1.10)
GFR calc non Af Amer: >90 mL/min (ref >90)
GLUCOSE: 102 mg/dL — AB (ref 70–99)
POTASSIUM: 4.2 meq/L (ref 3.7–5.3)
SODIUM: 143 meq/L (ref 137–147)
Total Bilirubin: 0.7 mg/dL (ref 0.3–1.2)
Total Protein: 6.6 g/dL (ref 6.0–8.3)

## 2013-04-12 LAB — CBC WITH DIFFERENTIAL/PLATELET
BASOS ABS: 0 10*3/uL (ref 0.0–0.1)
BASOS PCT: 0 % (ref 0–1)
EOS ABS: 0 10*3/uL (ref 0.0–0.7)
EOS PCT: 0 % (ref 0–5)
HCT: 35.8 % — ABNORMAL LOW (ref 36.0–46.0)
Hemoglobin: 13.5 g/dL (ref 12.0–15.0)
Lymphocytes Relative: 5 % — ABNORMAL LOW (ref 12–46)
Lymphs Abs: 0.7 10*3/uL (ref 0.7–4.0)
MCH: 24 pg — AB (ref 26.0–34.0)
MCHC: 37.7 g/dL — AB (ref 30.0–36.0)
MCV: 63.6 fL — AB (ref 78.0–100.0)
Monocytes Absolute: 1.2 10*3/uL — ABNORMAL HIGH (ref 0.1–1.0)
Monocytes Relative: 8 % (ref 3–12)
NEUTROS PCT: 87 % — AB (ref 43–77)
Neutro Abs: 13.6 10*3/uL — ABNORMAL HIGH (ref 1.7–7.7)
PLATELETS: 110 10*3/uL — AB (ref 150–400)
RBC: 5.63 MIL/uL — AB (ref 3.87–5.11)
RDW: 18.4 % — AB (ref 11.5–15.5)
WBC: 15.6 10*3/uL — ABNORMAL HIGH (ref 4.0–10.5)

## 2013-04-12 LAB — MAGNESIUM: MAGNESIUM: 1.5 mg/dL (ref 1.5–2.5)

## 2013-04-12 LAB — BASIC METABOLIC PANEL
BUN: 15 mg/dL (ref 6–23)
CALCIUM: 9.5 mg/dL (ref 8.4–10.5)
CO2: 18 mEq/L — ABNORMAL LOW (ref 19–32)
Chloride: 101 mEq/L (ref 96–112)
Creatinine, Ser: 0.57 mg/dL (ref 0.50–1.10)
GLUCOSE: 171 mg/dL — AB (ref 70–99)
POTASSIUM: 4.3 meq/L (ref 3.7–5.3)
SODIUM: 137 meq/L (ref 137–147)

## 2013-04-12 LAB — URINALYSIS, ROUTINE W REFLEX MICROSCOPIC
Bilirubin Urine: NEGATIVE
Glucose, UA: NEGATIVE mg/dL
Ketones, ur: NEGATIVE mg/dL
Leukocytes, UA: NEGATIVE
NITRITE: NEGATIVE
PROTEIN: NEGATIVE mg/dL
SPECIFIC GRAVITY, URINE: 1.018 (ref 1.005–1.030)
UROBILINOGEN UA: 0.2 mg/dL (ref 0.0–1.0)
pH: 6.5 (ref 5.0–8.0)

## 2013-04-12 LAB — PREGNANCY, URINE: PREG TEST UR: NEGATIVE

## 2013-04-12 LAB — PROTIME-INR
INR: 1.12 (ref 0.00–1.49)
PROTHROMBIN TIME: 14.2 s (ref 11.6–15.2)

## 2013-04-12 LAB — TSH: TSH: 0.335 u[IU]/mL — AB (ref 0.350–4.500)

## 2013-04-12 LAB — APTT: aPTT: 30 seconds (ref 24–37)

## 2013-04-12 LAB — URINE MICROSCOPIC-ADD ON

## 2013-04-12 MED ORDER — MORPHINE SULFATE 2 MG/ML IJ SOLN
1.0000 mg | INTRAMUSCULAR | Status: DC | PRN
  Administered 2013-04-12: 1 mg via INTRAVENOUS
  Administered 2013-04-13: 2 mg via INTRAVENOUS
  Filled 2013-04-12 (×2): qty 1

## 2013-04-12 MED ORDER — PROCHLORPERAZINE EDISYLATE 5 MG/ML IJ SOLN
10.0000 mg | Freq: Four times a day (QID) | INTRAMUSCULAR | Status: DC | PRN
  Filled 2013-04-12: qty 2

## 2013-04-12 MED ORDER — SODIUM CHLORIDE 0.9 % IV SOLN
INTRAVENOUS | Status: DC
  Administered 2013-04-12 – 2013-04-13 (×2): via INTRAVENOUS

## 2013-04-12 MED ORDER — SODIUM CHLORIDE 0.9 % IV SOLN: 1000.0000 mL | INTRAVENOUS | Status: DC

## 2013-04-12 MED ORDER — POLYETHYLENE GLYCOL 3350 17 G PO PACK: 17.0000 g | PACK | Freq: Every day | ORAL | Status: DC | PRN

## 2013-04-12 MED ORDER — ACETAMINOPHEN 325 MG PO TABS: 650.0000 mg | ORAL_TABLET | Freq: Four times a day (QID) | ORAL | Status: DC | PRN

## 2013-04-12 MED ORDER — OXYCODONE HCL 5 MG PO TABS
5.0000 mg | ORAL_TABLET | ORAL | Status: DC | PRN
Start: 2013-04-12 — End: 2013-04-13
  Administered 2013-04-12 – 2013-04-13 (×3): 5 mg via ORAL
  Filled 2013-04-12 (×3): qty 1

## 2013-04-12 MED ORDER — ONDANSETRON HCL 4 MG/2ML IJ SOLN
4.0000 mg | Freq: Four times a day (QID) | INTRAMUSCULAR | Status: DC | PRN
  Administered 2013-04-12: 4 mg via INTRAVENOUS
  Filled 2013-04-12: qty 2

## 2013-04-12 MED ORDER — ONDANSETRON HCL 4 MG/2ML IJ SOLN
4.0000 mg | Freq: Three times a day (TID) | INTRAMUSCULAR | Status: AC
  Administered 2013-04-12 – 2013-04-13 (×3): 4 mg via INTRAVENOUS
  Filled 2013-04-12 (×3): qty 2

## 2013-04-12 MED ORDER — PROMETHAZINE HCL 25 MG/ML IJ SOLN
12.5000 mg | Freq: Once | INTRAMUSCULAR | Status: AC
Start: 2013-04-12 — End: 2013-04-12
  Administered 2013-04-12: 12.5 mg via INTRAVENOUS
  Filled 2013-04-12: qty 1

## 2013-04-12 MED ORDER — ENOXAPARIN SODIUM 40 MG/0.4ML ~~LOC~~ SOLN
40.0000 mg | SUBCUTANEOUS | Status: DC
  Administered 2013-04-12: 40 mg via SUBCUTANEOUS
  Filled 2013-04-12 (×2): qty 0.4

## 2013-04-12 MED ORDER — SODIUM CHLORIDE 0.9 % IV SOLN
1000.0000 mL | Freq: Once | INTRAVENOUS | Status: AC
  Administered 2013-04-12: 1000 mL via INTRAVENOUS

## 2013-04-12 MED ORDER — GI COCKTAIL ~~LOC~~
30.0000 mL | Freq: Three times a day (TID) | ORAL | Status: DC | PRN
  Filled 2013-04-12: qty 30

## 2013-04-12 MED ORDER — METOCLOPRAMIDE HCL 5 MG/ML IJ SOLN
10.0000 mg | Freq: Once | INTRAMUSCULAR | Status: AC
  Administered 2013-04-12: 10 mg via INTRAVENOUS
  Filled 2013-04-12: qty 2

## 2013-04-12 MED ORDER — DIPHENHYDRAMINE HCL 50 MG/ML IJ SOLN
25.0000 mg | Freq: Once | INTRAMUSCULAR | Status: AC
  Administered 2013-04-12: 25 mg via INTRAVENOUS
  Filled 2013-04-12: qty 1

## 2013-04-12 MED ORDER — SODIUM CHLORIDE 0.9 % IV BOLUS (SEPSIS)
1000.0000 mL | Freq: Once | INTRAVENOUS | Status: AC
  Administered 2013-04-12: 1000 mL via INTRAVENOUS

## 2013-04-12 MED ORDER — SORBITOL 70 % SOLN
30.0000 mL | Freq: Every day | Status: DC | PRN
  Filled 2013-04-12: qty 30

## 2013-04-12 MED ORDER — ACETAMINOPHEN 650 MG RE SUPP: 650.0000 mg | Freq: Four times a day (QID) | RECTAL | Status: DC | PRN

## 2013-04-12 MED ORDER — MAGNESIUM CITRATE PO SOLN
1.0000 | Freq: Once | ORAL | Status: AC | PRN
  Filled 2013-04-12: qty 296

## 2013-04-12 NOTE — ED Notes (Signed)
Pt stated that she is unable to void at this time. 

## 2013-04-12 NOTE — ED Notes (Signed)
Patient transported to X-ray 

## 2013-04-12 NOTE — ED Notes (Signed)
Pt c/o generalized abd pain along with n/v/d that started around 0730 this morning. Pt stated that she woke up and went to the bathroom and pain started then.

## 2013-04-12 NOTE — ED Provider Notes (Addendum)
CSN: [id number]     Arrival date & time 04/12/13  1041 History   First MD Initiated Contact with Patient 04/12/13 1044     Chief Complaint  Patient presents with  . Abdominal Pain  . Emesis     (Consider location/radiation/quality/duration/timing/severity/associated sxs/prior Treatment) HPI Patient reports she woke up at 7 AM today with multiple episodes of vomiting and diarrhea. She states over 5 episodes each. She states the diarrhea is watery. She has diffuse abdominal pain. She has no known fever or chills. She denies any blood. 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 She reports her son and her brother had similar symptoms but not as bad 2 days ago.  PCP none  Past Medical History  Diagnosis Date  . Sickle cell trait   . Thrombocytopenia   . Urinary tract infection   . Thalassemia    Past Surgical History  Procedure Laterality Date  . Induced abortion      x2  . Tubal ligation  02/09/2011    Procedure: POST PARTUM TUBAL LIGATION;  Surgeon: Janine Limbo, MD;  Location: [facility];  Service: Gynecology;  Laterality: Bilateral;   Family History  Problem Relation Age of Onset  . Anesthesia problems Neg Hx    History  Substance Use Topics  . Smoking status: Current Every Day Smoker -- 0.50 packs/day  . Smokeless tobacco: Never Used     Comment: with preg  . Alcohol Use: 0.6 oz/week    1 Shots of liquor per week   employed  OB History   Grav Para Term Preterm Abortions TAB SAB Ect Mult Living   5 3 3  2 2    3      Review of Systems  All other systems reviewed and are negative.     Allergies  Review of patient's allergies indicates no known allergies.  Home Medications  No current outpatient prescriptions on file.    BP 146/56  Pulse 77  Temp(Src) 96.3 F (35.7 C) (Oral)  Resp 20  Ht 5\' 5"  (1.651 m)  Wt 130 lb (58.968 kg)  BMI 21.63 kg/m2  SpO2 100%  LMP 04/09/2013  Vital signs normal   Physical  Exam  Nursing note and vitals reviewed. Constitutional: She is oriented to person, place, and time. She appears well-developed and well-nourished.  Non-toxic appearance. She does not appear ill. She appears distressed.  She actively gagging and vomiting  HENT:  Head: Normocephalic and atraumatic.  Right Ear: External ear normal.  Left Ear: External ear normal.  Nose: Nose normal. No mucosal edema or rhinorrhea.  Mouth/Throat: Oropharynx is clear and moist and mucous membranes are normal. No dental abscesses or uvula swelling.  Eyes: Conjunctivae and EOM are normal. Pupils are equal, round, and reactive to light.  Neck: Normal range of motion and full passive range of motion without pain. Neck supple.  Cardiovascular: Normal rate, regular rhythm and normal heart sounds.  Exam reveals no gallop and no friction rub.   No murmur heard. Pulmonary/Chest: Effort normal and breath sounds normal. No respiratory distress. She has no wheezes. She has no rhonchi. She has no rales. She exhibits no tenderness and no crepitus.  Abdominal: Soft. Normal appearance and bowel sounds are normal. She exhibits no distension. There is tenderness. There is no rebound and no guarding.  Mild diffuse tenderness  Musculoskeletal: Normal range of motion. She exhibits no edema and no tenderness.  Moves all extremities well.  Neurological: She is alert and oriented to person, place, and time. She has normal strength. No cranial nerve deficit.  Skin: Skin is warm, dry and intact. No rash noted. No erythema. No pallor.  Psychiatric: She has a normal mood and affect. Her speech is normal and behavior is normal. Her mood appears not anxious.    ED Course  Procedures (including critical care time)  Medications  0.9 %  sodium chloride infusion (1,000 mLs Intravenous New Bag/Given 04/12/13 1120)    Followed by  0.9 %  sodium chloride infusion (1,000 mLs Intravenous New Bag/Given 04/12/13 1119)    Followed by  0.9 %  sodium  chloride infusion (not administered)  ondansetron (ZOFRAN) injection 4 mg (not administered)  metoCLOPramide (REGLAN) injection 10 mg (10 mg Intravenous Given 04/12/13 1113)  diphenhydrAMINE (BENADRYL) injection 25 mg (25 mg Intravenous Given 04/12/13 1113)  promethazine (PHENERGAN) injection 12.5 mg (12.5 mg Intravenous Given 04/12/13 1234)    Recheck at 1315 patient has had urinary output twice. She has had 2 L of IV fluids. She states her her nausea is controlled however she does not want to try oral fluids. She reports the PA student had examined her abdomen and now she's having intermittent abdominal pain. Patient was noted to have an anion gap of 18. Repeat blood work was done to see if it had resolved. Since patient indicated she wanted to go home it was felt she could be discharged if her acidosis had resolved.  Recheck at 1515. Nurses report patient is vomiting again. She states she is nauseated now again. I discussed with patient and her mother about admission for overnight observation and they are agreeable.  15:32 Dr Janee Morn, admit to med-surg, observation, team 10   Labs Review  Results for orders placed during the hospital encounter of 04/12/13  CBC WITH DIFFERENTIAL      Result Value Ref Range   WBC 15.6 (*) 4.0 - 10.5 K/uL   RBC 5.63 (*) 3.87 - 5.11 MIL/uL   Hemoglobin 13.5  12.0 - 15.0 g/dL   HCT 16.1 (*) 09.6 - 04.5 %   MCV 63.6 (*) 78.0 - 100.0 fL   MCH 24.0 (*) 26.0 - 34.0 pg   MCHC 37.7 (*) 30.0 - 36.0 g/dL   RDW 40.9 (*) 81.1 - 91.4 %   Platelets 110 (*) 150 - 400 K/uL   Neutrophils Relative % 87 (*) 43 - 77 %   Neutro Abs 13.6 (*) 1.7 - 7.7 K/uL   Lymphocytes Relative 5 (*) 12 - 46 %   Lymphs Abs 0.7  0.7 - 4.0 K/uL   Monocytes Relative 8  3 - 12 %   Monocytes Absolute 1.2 (*) 0.1 - 1.0 K/uL   Eosinophils Relative 0  0 - 5 %   Eosinophils Absolute 0.0  0.0 - 0.7 K/uL   Basophils Relative 0  0 - 1 %   Basophils Absolute 0.0  0.0 - 0.1 K/uL  BASIC METABOLIC  PANEL      Result Value Ref Range   Sodium 137  137 - 147 mEq/L   Potassium 4.3  3.7 - 5.3 mEq/L   Chloride 101  96 - 112 mEq/L   CO2 18 (*) 19 - 32 mEq/L   Glucose, Bld 171 (*) 70 - 99 mg/dL   BUN 15  6 - 23 mg/dL   Creatinine, Ser 7.82  0.50 - 1.10 mg/dL   Calcium 9.5  8.4 - 95.6 mg/dL   GFR calc non  Af Amer >90  >90 mL/min   GFR calc Af Amer >90  >90 mL/min  URINALYSIS, ROUTINE W REFLEX MICROSCOPIC      Result Value Ref Range   Color, Urine YELLOW  YELLOW   APPearance CLEAR  CLEAR   Specific Gravity, Urine 1.018  1.005 - 1.030   pH 6.5  5.0 - 8.0   Glucose, UA NEGATIVE  NEGATIVE mg/dL   Hgb urine dipstick MODERATE (*) NEGATIVE   Bilirubin Urine NEGATIVE  NEGATIVE   Ketones, ur NEGATIVE  NEGATIVE mg/dL   Protein, ur NEGATIVE  NEGATIVE mg/dL   Urobilinogen, UA 0.2  0.0 - 1.0 mg/dL   Nitrite NEGATIVE  NEGATIVE   Leukocytes, UA NEGATIVE  NEGATIVE  PREGNANCY, URINE      Result Value Ref Range   Preg Test, Ur NEGATIVE  NEGATIVE  URINE MICROSCOPIC-ADD ON      Result Value Ref Range   Squamous Epithelial / LPF RARE  RARE   RBC / HPF 3-6  <3 RBC/hpf  I-STAT CHEM 8, ED      Result Value Ref Range   Sodium 141  137 - 147 mEq/L   Potassium 3.9  3.7 - 5.3 mEq/L   Chloride 108  96 - 112 mEq/L   BUN 11  6 - 23 mg/dL   Creatinine, Ser 1.610.50  0.50 - 1.10 mg/dL   Glucose, Bld 096125 (*) 70 - 99 mg/dL   Calcium, Ion 0.451.11 (*) 1.12 - 1.23 mmol/L   TCO2 22  0 - 100 mmol/L   Hemoglobin 11.9 (*) 12.0 - 15.0 g/dL   HCT 40.935.0 (*) 81.136.0 - 91.446.0 %   Laboratory interpretation all normal except mild hyperglycemia, metabolic acidosis with anion gap of 18, repeat I-stat 8 after 2 liters of fluid shows anion gap has resolved.      Imaging Review No results found.   EKG Interpretation None      MDM   Final diagnoses:  Nausea vomiting and diarrhea  Dehydration  Metabolic acidosis    Plan admission  Devoria AlbeIva Cassy Sprowl, MD, Franz DellFACEP    Shamir Sedlar L Marialuisa Basara, MD 04/12/13 1542  Ward GivensIva L Iveliz Garay, MD 04/12/13  [phone]  Ward GivensIva L Jaidalyn Schillo, MD 04/12/13 1547

## 2013-04-12 NOTE — H&P (Signed)
[facility] History and Physical  Erin EmmsCharliene J Messina WUJ:[id number] DOB: 02-29-1985 DOA: 04/12/2013  Referring physician: Dr Devoria AlbeIva Knapp PCP: Default, Provider, MD   Chief Complaint: Nausea, vomiting, diarrhea  HPI: Erin Archer is a 28 y.o. female  This history of sickle cell trait, thalassemia, thrombocytopenia who presents to the ED with a several hour history of diffuse abdominal pain, nausea, nonbloody emesis, diarrhea. Patient stated that she had multiple loose bowel movements. Patient stated she woke up at 7:30 in the morning when the symptoms started patient does have some associated chills and generalized weakness. Patient denies any fevers, no recent travel, no use of well water, no dysuria, no cough, no constipation, no palpitations. Patient denies any recent antibiotic use, no melena, no hematemesis, no hematochezia. Patient with no other associated symptoms. Patient was seen in the emergency room urinalysis which was done was unremarkable. Basic metabolic profile done had a bicarbonate of 18 and a glucose of 171 otherwise was unremarkable. CBC done at a white count of 15.6 platelet count of 100 and will normal limits. We were called to admit the patient for further evaluation and management.  Review of Systems: As per history otherwise negative. Constitutional:  No weight loss, night sweats, Fevers, chills, fatigue.  HEENT:  No headaches, Difficulty swallowing,Tooth/dental problems,Sore throat,  No sneezing, itching, ear ache, nasal congestion, post nasal drip,  Cardio-vascular:  No chest pain, Orthopnea, PND, swelling in lower extremities, anasarca, dizziness, palpitations  GI:  No heartburn, indigestion, abdominal pain, nausea, vomiting, diarrhea, change in bowel habits, loss of appetite  Resp:  No shortness of breath with exertion or at rest. No excess mucus, no productive cough, No non-productive cough, No coughing up of blood.No change in color of mucus.No  wheezing.No chest wall deformity  Skin:  no rash or lesions.  GU:  no dysuria, change in color of urine, no urgency or frequency. No flank pain.  Musculoskeletal:  No joint pain or swelling. No decreased range of motion. No back pain.  Psych:  No change in mood or affect. No depression or anxiety. No memory loss.   Past Medical History  Diagnosis Date  . Sickle cell trait   . Thrombocytopenia   . Urinary tract infection   . Thalassemia    Past Surgical History  Procedure Laterality Date  . Induced abortion      x2  . Tubal ligation  02/09/2011    Procedure: POST PARTUM TUBAL LIGATION;  Surgeon: Janine LimboArthur V Stringer, MD;  Location: [facility];  Service: Gynecology;  Laterality: Bilateral;   Social History:  reports that she has been smoking.  She has never used smokeless tobacco. She reports that she drinks about .6 ounces of alcohol per week. She reports that she uses illicit drugs (Marijuana).  No Known Allergies  Family History  Problem Relation Age of Onset  . Anesthesia problems Neg Hx      Prior to Admission medications   Not on File   Physical Exam: Filed Vitals:   04/12/13 1240  BP: 144/82  Pulse: 83  Temp:   Resp: 18    BP 144/82  Pulse 83  Temp(Src) 97.6 F (36.4 C) (Rectal)  Resp 18  Ht 5\' 5"  (1.651 m)  Wt 58.968 kg (130 lb)  BMI 21.63 kg/m2  SpO2 100%  LMP 04/09/2013  General:  Appears calm and comfortable. With emesis bag close by. No acute cardiopulmonary distress. Eyes: PERRLA, EOMI, normal lids, irises & conjunctiva ENT: grossly normal hearing, lips &  tongue. Dry mucous membranes. Neck: no LAD, masses or thyromegaly Cardiovascular: RRR, no m/r/g. No LE edema. Respiratory: CTA bilaterally, no w/r/r. Normal respiratory effort. Abdomen: soft, ntnd, no rebound, no guarding, Skin: no rash or induration seen on limited exam Musculoskeletal: grossly normal tone BUE/BLE Psychiatric: grossly normal mood and affect, speech fluent and  appropriate Neurologic: Alert and oriented x3. Cranial nerves II through XII are grossly intact. No focal deficits. Sensation is intact. Visual fields are intact. Gait not tested.           Labs on Admission:  Basic Metabolic Panel:  Recent Labs Lab 04/12/13 1105 04/12/13 1358  NA 137 141  K 4.3 3.9  CL 101 108  CO2 18*  --   GLUCOSE 171* 125*  BUN 15 11  CREATININE 0.57 0.50  CALCIUM 9.5  --    Liver Function Tests: No results found for this basename: AST, ALT, ALKPHOS, BILITOT, PROT, ALBUMIN,  in the last 168 hours No results found for this basename: LIPASE, AMYLASE,  in the last 168 hours No results found for this basename: AMMONIA,  in the last 168 hours CBC:  Recent Labs Lab 04/12/13 1105 04/12/13 1358  WBC 15.6*  --   NEUTROABS 13.6*  --   HGB 13.5 11.9*  HCT 35.8* 35.0*  MCV 63.6*  --   PLT 110*  --    Cardiac Enzymes: No results found for this basename: CKTOTAL, CKMB, CKMBINDEX, TROPONINI,  in the last 168 hours  BNP (last 3 results) No results found for this basename: PROBNP,  in the last 8760 hours CBG: No results found for this basename: GLUCAP,  in the last 168 hours  Radiological Exams on Admission: No results found.  EKG: Independently reviewed. Normal sinus rhythm.  Assessment/Plan Principal Problem:   Nausea vomiting and diarrhea Active Problems:   Sickle cell trait   Dehydration   Metabolic acidosis   #1 nausea/ vomiting and diarrhea Questionable etiology. Patient endorses sick contacts with her son who is has recovered. Patient with no further nausea or vomiting. Patient tolerating clear liquids. Well admit to MedSurg bed. Placed on IV fluids, check an acute abdominal series, check a magnesium level, check a comprehensive metabolic profile. Place on IV fluids, supportive care, pain management. Will also check stool for C. difficile PCR. Follow.  #2 dehydration Secondary to problem #1. Will place on IV fluids and follow.  #3 sickle  cell trait Stable.  #4 leukocytosis Questionable etiology. Urinalysis was negative. Will check an acute abdominal series. No need for antibiotics at this time. Follow.  #5 metabolic acidosis Likely secondary to problem #1. Will get serial BMETs. Continue IV fluids.  5 prophylaxis PPI for GI prophylaxis. SCDs for DVT prophylaxis.  Code Status: Full code. Family Communication: updated patient at bedside. Disposition Plan: Admit to medsurg under observation.  Time spent: 60 mins  Anne Fu [facility] Pager [phone]

## 2013-04-13 DIAGNOSIS — K529 Noninfective gastroenteritis and colitis, unspecified: Secondary | ICD-10-CM | POA: Diagnosis present

## 2013-04-13 DIAGNOSIS — K5289 Other specified noninfective gastroenteritis and colitis: Secondary | ICD-10-CM

## 2013-04-13 LAB — BASIC METABOLIC PANEL
BUN: 7 mg/dL (ref 6–23)
CALCIUM: 8.2 mg/dL — AB (ref 8.4–10.5)
CO2: 24 mEq/L (ref 19–32)
Chloride: 104 mEq/L (ref 96–112)
Creatinine, Ser: 0.66 mg/dL (ref 0.50–1.10)
GFR calc Af Amer: >90 mL/min (ref >90)
Glucose, Bld: 81 mg/dL (ref 70–99)
Potassium: 3.5 mEq/L — ABNORMAL LOW (ref 3.7–5.3)
SODIUM: 142 meq/L (ref 137–147)

## 2013-04-13 LAB — HEMOGLOBIN A1C
Hgb A1c MFr Bld: 4.2 % (ref <5.7)
Mean Plasma Glucose: 74 mg/dL (ref <117)

## 2013-04-13 LAB — CBC
HCT: 29.2 % — ABNORMAL LOW (ref 36.0–46.0)
Hemoglobin: 10.6 g/dL — ABNORMAL LOW (ref 12.0–15.0)
MCH: 23.5 pg — AB (ref 26.0–34.0)
MCHC: 36.3 g/dL — ABNORMAL HIGH (ref 30.0–36.0)
MCV: 64.7 fL — ABNORMAL LOW (ref 78.0–100.0)
PLATELETS: 118 10*3/uL — AB (ref 150–400)
RBC: 4.51 MIL/uL (ref 3.87–5.11)
RDW: 17.9 % — AB (ref 11.5–15.5)
WBC: 7.7 10*3/uL (ref 4.0–10.5)

## 2013-04-13 LAB — MAGNESIUM: Magnesium: 2.6 mg/dL — ABNORMAL HIGH (ref 1.5–2.5)

## 2013-04-13 LAB — CLOSTRIDIUM DIFFICILE BY PCR: Toxigenic C. Difficile by PCR: NEGATIVE

## 2013-04-13 LAB — LIPASE, BLOOD: LIPASE: 15 U/L (ref 11–59)

## 2013-04-13 MED ORDER — POTASSIUM CHLORIDE CRYS ER 20 MEQ PO TBCR
40.0000 meq | EXTENDED_RELEASE_TABLET | Freq: Once | ORAL | Status: AC
  Administered 2013-04-13: 40 meq via ORAL
  Filled 2013-04-13: qty 2

## 2013-04-13 MED ORDER — MAGNESIUM SULFATE 4000MG/100ML IJ SOLN
4.0000 g | Freq: Once | INTRAMUSCULAR | Status: AC
  Administered 2013-04-13: 4 g via INTRAVENOUS
  Filled 2013-04-13: qty 100

## 2013-04-13 MED ORDER — OXYCODONE HCL 5 MG PO TABS: 5.0000 mg | ORAL_TABLET | ORAL | Status: DC | PRN

## 2013-04-13 MED ORDER — WHITE PETROLATUM GEL
Status: AC
  Filled 2013-04-13: qty 5

## 2013-04-13 NOTE — Discharge Summary (Signed)
Physician Discharge Summary  Erin Archer ZOX:[id number] DOB: 06/08/1985 DOA: 04/12/2013  PCP: Default, Provider, MD  Admit date: 04/12/2013 Discharge date: 04/13/2013  Time spent: 60 minutes  Recommendations for Outpatient Follow-up:  1. Followup with PCP one week post discharge. On followup and a basic metabolic profile needs to be obtained to followup on electrolytes and renal function.  Discharge Diagnoses:  Principal Problem:   Gastroenteritis Active Problems:   Sickle cell trait   Nausea vomiting and diarrhea   Dehydration   Metabolic acidosis   Discharge Condition: Stable and improved  Diet recommendation: Soft bland diet/regular  Filed Weights   04/12/13 1050 04/13/13 0541  Weight: 58.968 kg (130 lb) 63.685 kg (140 lb 6.4 oz)    History of present illness:  Erin EmmsCharliene J Astarita is a 28 y.o. female  This history of sickle cell trait, thalassemia, thrombocytopenia who presents to the ED with a several hour history of diffuse abdominal pain, nausea, nonbloody emesis, diarrhea. Patient stated that she had multiple loose bowel movements. Patient stated she woke up at 7:30 in the morning when the symptoms started patient does have some associated chills and generalized weakness. Patient denies any fevers, no recent travel, no use of well water, no dysuria, no cough, no constipation, no palpitations. Patient denies any recent antibiotic use, no melena, no hematemesis, no hematochezia. Patient with no other associated symptoms.  Patient was seen in the emergency room urinalysis which was done was unremarkable. Basic metabolic profile done had a bicarbonate of 18 and a glucose of 171 otherwise was unremarkable. CBC done at a white count of 15.6 platelet count of 100 and will normal limits.  We were called to admit the patient for further evaluation and management.   Hospital Course:  #1 nausea/ vomiting /diarrhea/probable viral gastroenteritis Patient had presented with a  several hour history of nausea vomiting and diarrhea with diffuse abdominal pain. Patient was seen in the emergency room which was given some IV fluids. Due to patient's intractable nausea and vomiting patient was admitted to the patient. Patient underwent an acute abdominal series which was negative for any obstruction or abnormalities. Patient was initially placed on clear liquids, IV fluids, anti-emetics, supportive care and pain management. C. difficile PCR was obtained which was negative. Patient improved clinically diet was subsequently advanced and patient was tolerating a solid diet by day of discharge. Patient be discharged in stable and improved condition and is to followup with PCP one week post discharge .  #2 dehydration Secondary to problem #1. Patient was hydrated with IV fluids and was euvolemic by day of discharge.  #3 leukocytosis On admission patient was noted to have a leukocytosis. Patient remained afebrile. Acute abdominal series which was obtained was negative for any acute abnormalities. Urinalysis which was done was also negative. Patient  was hydrated and placed on supportive care. Patient's leukocytosis had resolved by day of discharge.  #4 metabolic acidosis Patient was noted to be the metabolic acidosis which was felt to be secondary to problem #1. Patient was placed on IV fluids, supportive care. Patient's metabolic acidosis had resolved by day of discharge.  The rest of patient's chronic medical issues were stable throughout the hospitalization the patient be discharged in stable and improved condition.   Procedures:  Acute abdominal series 04/12/2013  Consultations:  None  Discharge Exam: Filed Vitals:   04/13/13 0541  BP: 122/84  Pulse: 77  Temp: 98.5 F (36.9 C)  Resp: 16    General: NAD Cardiovascular: RRR  Respiratory: CTAB  Discharge Instructions You were cared for by a hospitalist during your hospital stay. If you have any questions about your  discharge medications or the care you received while you were in the hospital after you are discharged, you can call the unit and asked to speak with the hospitalist on call if the hospitalist that took care of you is not available. Once you are discharged, your primary care physician will handle any further medical issues. Please note that NO REFILLS for any discharge medications will be authorized once you are discharged, as it is imperative that you return to your primary care physician (or establish a relationship with a primary care physician if you do not have one) for your aftercare needs so that they can reassess your need for medications and monitor your lab values.      Discharge Orders   Future Orders Complete By Expires   Diet general  As directed    Scheduling Instructions:   Sift/bland diet.   Discharge instructions  As directed    Increase activity slowly  As directed        Medication List         oxyCODONE 5 MG immediate release tablet  Commonly known as:  Oxy IR/ROXICODONE  Take 1 tablet (5 mg total) by mouth every 4 (four) hours as needed for moderate pain.       No Known Allergies Follow-up Information   Follow up with Default, Provider, MD. Schedule an appointment as soon as possible for a visit in 1 week. (f/u with PCP in 1 week.)        The results of significant diagnostics from this hospitalization (including imaging, microbiology, ancillary and laboratory) are listed below for reference.    Significant Diagnostic Studies: Dg Abd Acute W/chest  04/12/2013   CLINICAL DATA:  Nausea vomiting and diarrhea  EXAM: ACUTE ABDOMEN SERIES (ABDOMEN 2 VIEW & CHEST 1 VIEW)  COMPARISON:  Chest radiograph 03/29/2010  FINDINGS: An AP view of the chest the patient upright position, and supine and right decubitus views of the abdomen are submitted. There is no evidence of dilated bowel loops or free intraperitoneal air. No radiopaque calculi or other significant radiographic  abnormality is seen. Heart size and mediastinal contours are within normal limits. Both lungs are clear.  IMPRESSION: Negative abdominal radiographs.  No acute cardiopulmonary disease.   Electronically Signed   By: Britta Mccreedy M.D.   On: 04/12/2013 17:20    Microbiology: Recent Results (from the past 240 hour(s))  CLOSTRIDIUM DIFFICILE BY PCR     Status: None   Collection Time    04/13/13  7:54 AM      Result Value Ref Range Status   C difficile by pcr NEGATIVE  NEGATIVE Final     Labs: Basic Metabolic Panel:  Recent Labs Lab 04/12/13 1105 04/12/13 1358 04/12/13 2100 04/13/13 1300  NA 137 141 143 142  K 4.3 3.9 4.2 3.5*  CL 101 108 108 104  CO2 18*  --  22 24  GLUCOSE 171* 125* 102* 81  BUN 15 11 9 7   CREATININE 0.57 0.50 0.48* 0.66  CALCIUM 9.5  --  8.4 8.2*  MG  --   --  1.5 2.6*   Liver Function Tests:  Recent Labs Lab 04/12/13 2100  AST 19  ALT 17  ALKPHOS 52  BILITOT 0.7  PROT 6.6  ALBUMIN 3.5    Recent Labs Lab 04/13/13 1300  LIPASE 15  No results found for this basename: AMMONIA,  in the last 168 hours CBC:  Recent Labs Lab 04/12/13 1105 04/12/13 1358 04/13/13 1300  WBC 15.6*  --  7.7  NEUTROABS 13.6*  --   --   HGB 13.5 11.9* 10.6*  HCT 35.8* 35.0* 29.2*  MCV 63.6*  --  64.7*  PLT 110*  --  118*   Cardiac Enzymes: No results found for this basename: CKTOTAL, CKMB, CKMBINDEX, TROPONINI,  in the last 168 hours BNP: BNP (last 3 results) No results found for this basename: PROBNP,  in the last 8760 hours CBG: No results found for this basename: GLUCAP,  in the last 168 hours     Signed:  Rodolph Bong MD [facility] 04/13/2013, 3:38 PM

## 2013-04-14 LAB — URINE CULTURE: Colony Count: 70000

## 2013-07-29 ENCOUNTER — Emergency Department (HOSPITAL_COMMUNITY)
Admission: EM | Admit: 2013-07-29 | Discharge: 2013-07-29 | Disposition: A | Discharge status: Home / Self Care | Payer: Medicaid Other | Attending: Emergency Medicine | Admitting: Emergency Medicine

## 2013-07-29 ENCOUNTER — Encounter (HOSPITAL_COMMUNITY): Payer: Self-pay | Admitting: Emergency Medicine

## 2013-07-29 DIAGNOSIS — M2550 Pain in unspecified joint: Secondary | ICD-10-CM

## 2013-07-29 DIAGNOSIS — Z791 Long term (current) use of non-steroidal anti-inflammatories (NSAID): Secondary | ICD-10-CM | POA: Insufficient documentation

## 2013-07-29 DIAGNOSIS — M79609 Pain in unspecified limb: Secondary | ICD-10-CM | POA: Diagnosis present

## 2013-07-29 DIAGNOSIS — F17200 Nicotine dependence, unspecified, uncomplicated: Secondary | ICD-10-CM | POA: Insufficient documentation

## 2013-07-29 DIAGNOSIS — IMO0001 Reserved for inherently not codable concepts without codable children: Secondary | ICD-10-CM | POA: Diagnosis not present

## 2013-07-29 DIAGNOSIS — N898 Other specified noninflammatory disorders of vagina: Secondary | ICD-10-CM | POA: Diagnosis not present

## 2013-07-29 DIAGNOSIS — Z87440 Personal history of urinary (tract) infections: Secondary | ICD-10-CM | POA: Insufficient documentation

## 2013-07-29 DIAGNOSIS — Z862 Personal history of diseases of the blood and blood-forming organs and certain disorders involving the immune mechanism: Secondary | ICD-10-CM | POA: Diagnosis not present

## 2013-07-29 DIAGNOSIS — M791 Myalgia, unspecified site: Secondary | ICD-10-CM

## 2013-07-29 LAB — URINALYSIS, ROUTINE W REFLEX MICROSCOPIC
BILIRUBIN URINE: NEGATIVE
Glucose, UA: NEGATIVE mg/dL
HGB URINE DIPSTICK: NEGATIVE
KETONES UR: NEGATIVE mg/dL
Leukocytes, UA: NEGATIVE
Nitrite: NEGATIVE
PROTEIN: NEGATIVE mg/dL
Specific Gravity, Urine: 1.026 (ref 1.005–1.030)
UROBILINOGEN UA: 1 mg/dL (ref 0.0–1.0)
pH: 6.5 (ref 5.0–8.0)

## 2013-07-29 LAB — WET PREP, GENITAL
Trich, Wet Prep: NONE SEEN
YEAST WET PREP: NONE SEEN

## 2013-07-29 MED ORDER — IBUPROFEN 800 MG PO TABS: 800.0000 mg | ORAL_TABLET | Freq: Three times a day (TID) | ORAL | Status: DC

## 2013-07-29 NOTE — ED Notes (Signed)
Pt reports bilateral legs ache since Sunday.   Pt states she has hit both legs several times on her chair at work.  No back pain.  Pt reports bilateral calf pain, more on left side.  Pulses present.  No swelling

## 2013-07-29 NOTE — Discharge Instructions (Signed)
1. Medications: ibuprofen, usual home medications 2. Treatment: rest, drink plenty of fluids,  3. Follow Up: Please followup with your primary doctor for discussion of your diagnoses and further evaluation after today's visit; if you do not have a primary care doctor use the resource guide provided to find one;      Musculoskeletal Pain Musculoskeletal pain is muscle and boney aches and pains. These pains can occur in any part of the body. Your caregiver may treat you without knowing the cause of the pain. They may treat you if blood or urine tests, X-rays, and other tests were normal.  CAUSES There is often not a definite cause or reason for these pains. These pains may be caused by a type of germ (virus). The discomfort may also come from overuse. Overuse includes working out too hard when your body is not fit. Boney aches also come from weather changes. Bone is sensitive to atmospheric pressure changes. HOME CARE INSTRUCTIONS   Ask when your test results will be ready. Make sure you get your test results.  Only take over-the-counter or prescription medicines for pain, discomfort, or fever as directed by your caregiver. If you were given medications for your condition, do not drive, operate machinery or power tools, or sign legal documents for 24 hours. Do not drink alcohol. Do not take sleeping pills or other medications that may interfere with treatment.  Continue all activities unless the activities cause more pain. When the pain lessens, slowly resume normal activities. Gradually increase the intensity and duration of the activities or exercise.  During periods of severe pain, bed rest may be helpful. Lay or sit in any position that is comfortable.  Putting ice on the injured area.  Put ice in a bag.  Place a towel between your skin and the bag.  Leave the ice on for 15 to 20 minutes, 3 to 4 times a day.  Follow up with your caregiver for continued problems and no reason can be  found for the pain. If the pain becomes worse or does not go away, it may be necessary to repeat tests or do additional testing. Your caregiver may need to look further for a possible cause. SEEK IMMEDIATE MEDICAL CARE IF:  You have pain that is getting worse and is not relieved by medications.  You develop chest pain that is associated with shortness or breath, sweating, feeling sick to your stomach (nauseous), or throw up (vomit).  Your pain becomes localized to the abdomen.  You develop any new symptoms that seem different or that concern you. MAKE SURE YOU:   Understand these instructions.  Will watch your condition.  Will get help right away if you are not doing well or get worse. Document Released: 12/19/2004 Document Revised: 03/13/2011 Document Reviewed: 08/23/2012 [facility] Patient Information 2015 [place], [place]. This information is not intended to replace advice given to you by your health care provider. Make sure you discuss any questions you have with your health care provider.

## 2013-07-29 NOTE — ED Provider Notes (Signed)
CSN: [id number]     Arrival date & time 07/29/13  1635 History   None    This chart was scribed for non-physician practitioner, Dierdre Forth PA-C working with Ward Givens, MD by Arlan Organ, ED Scribe. This patient was seen in room TR09C/TR09C and the patient's care was started at 4:59 PM.   Chief Complaint  Patient presents with  . Leg Pain   The history is provided by the patient. No language interpreter was used.    HPI Comments: ALONIA DIBUONO is a 28 y.o. Female with a PMHx of sickle cell trait who presents to the Emergency Department complaining of waxing and waning, moderate bilateral lower extremity pain x 2 days that is unchanged. Pt describes this pain as achy and currently rates 3/10.  No recent injury or trauma, however, pt originally attributed discomfort to a possible UTI as she has had leg pain with UTIs in the past, but has no dysuria. She states this pain is worsened in her knees bilaterally at this time. Discomfort is exacerbated when sitting. No alleviating factors at this time. She has tried OTC Tylenol and Ibuprofen with mild temporary improvement for symptoms. At this time she denies any fever or chills. No back pain. She denies a history of blood clots. No known allergies to medications. She has no other pertinent past medical history. No other concerns this visit.    Past Medical History  Diagnosis Date  . Sickle cell trait   . Thrombocytopenia   . Urinary tract infection   . Thalassemia    Past Surgical History  Procedure Laterality Date  . Induced abortion      x2  . Tubal ligation  02/09/2011    Procedure: POST PARTUM TUBAL LIGATION;  Surgeon: Janine Limbo, MD;  Location: [facility];  Service: Gynecology;  Laterality: Bilateral;   Family History  Problem Relation Age of Onset  . Anesthesia problems Neg Hx    History  Substance Use Topics  . Smoking status: Current Every Day Smoker -- 0.50 packs/day  . Smokeless tobacco: Never Used     Comment:  with preg  . Alcohol Use: 0.6 oz/week    1 Shots of liquor per week   OB History   Grav Para Term Preterm Abortions TAB SAB Ect Mult Living   5 3 3  2 2    3      Review of Systems  Constitutional: Negative for fever, diaphoresis, appetite change, fatigue and unexpected weight change.  HENT: Negative for mouth sores.   Eyes: Negative for visual disturbance.  Respiratory: Negative for cough, chest tightness, shortness of breath and wheezing.   Cardiovascular: Negative for chest pain.  Gastrointestinal: Negative for nausea, vomiting, abdominal pain, diarrhea and constipation.  Endocrine: Negative for polydipsia, polyphagia and polyuria.  Genitourinary: Negative for dysuria, urgency, frequency and hematuria.  Musculoskeletal: Positive for arthralgias (Bilateral lower extremities). Negative for back pain and neck stiffness.  Skin: Negative for rash.  Allergic/Immunologic: Negative for immunocompromised state.  Neurological: Negative for syncope, light-headedness and headaches.  Hematological: Does not bruise/bleed easily.  Psychiatric/Behavioral: Negative for sleep disturbance. The patient is not nervous/anxious.   All other systems reviewed and are negative.     Allergies  Review of patient's allergies indicates no known allergies.  Home Medications   Prior to Admission medications   Medication Sig Start Date End Date Taking? Authorizing Provider  acetaminophen (TYLENOL) 500 MG tablet Take 500 mg by mouth every 6 (six) hours as needed  for mild pain.   Yes Historical Provider, MD  ibuprofen (ADVIL,MOTRIN) 200 MG tablet Take 200 mg by mouth every 6 (six) hours as needed for moderate pain.   Yes Historical Provider, MD  ibuprofen (ADVIL,MOTRIN) 800 MG tablet Take 1 tablet (800 mg total) by mouth 3 (three) times daily. 07/29/13   Sandee Bernath, PA-C   Triage Vitals: BP 118/72  Pulse 84  Temp(Src) 97.7 F (36.5 C) (Oral)  Resp 18  SpO2 94%   Physical Exam  Nursing note and  vitals reviewed. Constitutional: She appears well-developed and well-nourished. No distress.  Awake, alert, nontoxic appearance  HENT:  Head: Normocephalic and atraumatic.  Mouth/Throat: Oropharynx is clear and moist. No oropharyngeal exudate.  Eyes: Conjunctivae are normal. No scleral icterus.  Neck: Normal range of motion. Neck supple.  Full ROM without pain  Cardiovascular: Normal rate, regular rhythm and intact distal pulses.   Pulmonary/Chest: Effort normal and breath sounds normal. No respiratory distress. She has no wheezes.  Abdominal: Soft. Bowel sounds are normal. She exhibits no distension and no mass. There is no tenderness. There is no rebound and no guarding. Hernia confirmed negative in the right inguinal area and confirmed negative in the left inguinal area.  Genitourinary: There is no rash, tenderness, lesion or injury on the right labia. There is no rash, tenderness, lesion or injury on the left labia. Uterus is not deviated, not enlarged, not fixed and not tender. Cervix exhibits no motion tenderness, no discharge and no friability. Right adnexum displays no mass, no tenderness and no fullness. Left adnexum displays no mass, no tenderness and no fullness. No erythema, tenderness or bleeding around the vagina. No foreign body around the vagina. No signs of injury around the vagina. Vaginal discharge (Moderate, thin, off white, and non-odorant) found.  Musculoskeletal: Normal range of motion. She exhibits no edema.  Full range of motion of the T-spine and L-spine No tenderness to palpation of the spinous processes of the T-spine or L-spine No tenderness to palpation of the paraspinous muscles of the L-spine No pain to palpation of the bilateral calves No peripheral edema Negative Homans sign No palpable cord  Lymphadenopathy:    She has no cervical adenopathy.       Right: No inguinal adenopathy present.       Left: No inguinal adenopathy present.  Neurological: She is  alert. She has normal reflexes. GCS eye subscore is 4. GCS verbal subscore is 5. GCS motor subscore is 6.  Reflex Scores:      Bicep reflexes are 2+ on the right side and 2+ on the left side.      Brachioradialis reflexes are 2+ on the right side and 2+ on the left side.      Patellar reflexes are 2+ on the right side and 2+ on the left side.      Achilles reflexes are 2+ on the right side and 2+ on the left side. Speech is clear and goal oriented, follows commands Normal 5/5 strength in upper and lower extremities bilaterally including dorsiflexion and plantar flexion, strong and equal grip strength Sensation normal to light and sharp touch Moves extremities without ataxia, coordination intact Normal gait Normal balance   Skin: Skin is warm and dry. No rash noted. She is not diaphoretic. No erythema.  Psychiatric: She has a normal mood and affect. Her behavior is normal.    ED Course  Procedures (including critical care time)  DIAGNOSTIC STUDIES: Oxygen Saturation is 94% on RA, adequate by  my interpretation.    COORDINATION OF CARE: 5:08 PM- Will order Urinalysis. Discussed treatment plan with pt at bedside and pt agreed to plan.     Labs Review Labs Reviewed  WET PREP, GENITAL - Abnormal; Notable for the following:    Clue Cells Wet Prep HPF POC FEW (*)    WBC, Wet Prep HPF POC FEW (*)    All other components within normal limits  URINALYSIS, ROUTINE W REFLEX MICROSCOPIC - Abnormal; Notable for the following:    APPearance CLOUDY (*)    All other components within normal limits  GC/CHLAMYDIA PROBE AMP    Imaging Review No results found.   EKG Interpretation None      MDM   Final diagnoses:  Myalgia  Arthralgia   Niobe J Peet presents with bilateral leg pain aching for several days.  She reports that it began in her thighs and is now in the entire leg.  She reports she called her PCP who reported that they "don't see pain complaints."  Nursing note reports  pain in calves however pt reports that the pain in her calves is part of the aching of her entire leg and there is no isolated or worsening pain in her calves.  Patient is without clinical evidence of DVT and she is low risk denying recent travel, recent surgery, history of blood clots, exogenous estrogen use.  UA without evidence of urinary tract infection.  Pelvic exam and wet prep without evidence of pelvic inflammatory disease she has no cervical motion tenderness, small amount of discharge which is nonodorous and only a few white blood cells on wet prep.  Abdomen is soft nontender, no lymphadenopathy in the groin or pain to palpation.  At this time patient's pain be secondary to a virus, muscle strain.    I have personally reviewed patient's vitals, nursing note and any pertinent labs or imaging.  I performed an undressed physical exam.    At this time, it has been determined that no acute conditions requiring further emergency intervention. The patient/guardian have been advised of the diagnosis and plan. I reviewed all labs and imaging including any potential incidental findings. We have discussed signs and symptoms that warrant return to the ED, such as though back pain, difficulty walking, dysuria, vaginal discharge, high fevers, swelling of either or both legs.  Patient/guardian has voiced understanding and agreed to follow-up with the PCP or specialist in 3 days.  Vital signs are stable at discharge.   BP 118/72  Pulse 84  Temp(Src) 97.7 F (36.5 C) (Oral)  Resp 18  SpO2 94%  The patient was discussed with Dr. Devoria Albe who agrees with the treatment plan.  I personally performed the services described in this documentation, which was scribed in my presence. The recorded information has been reviewed and is accurate.    Dahlia Client Rajendra Spiller, PA-C 07/29/13 1851

## 2013-07-29 NOTE — ED Provider Notes (Signed)
Medical screening examination/treatment/procedure(s) were performed by non-physician practitioner and as supervising physician I was immediately available for consultation/collaboration.   EKG Interpretation None      Devoria AlbeIva Quiana Cobaugh, MD, Armando GangFACEP   Ward GivensIva L Jael Waldorf, MD 07/29/13 [phone]

## 2013-07-29 NOTE — ED Notes (Signed)
Wheelchair declined 

## 2013-07-30 LAB — GC/CHLAMYDIA PROBE AMP
CT PROBE, AMP APTIMA: NEGATIVE
GC PROBE AMP APTIMA: NEGATIVE

## 2013-11-03 ENCOUNTER — Encounter (HOSPITAL_COMMUNITY): Payer: Self-pay | Admitting: Emergency Medicine

## 2015-03-08 ENCOUNTER — Encounter (HOSPITAL_COMMUNITY): Payer: Self-pay | Admitting: *Deleted

## 2015-03-08 ENCOUNTER — Emergency Department (HOSPITAL_COMMUNITY)
Admission: EM | Admit: 2015-03-08 | Discharge: 2015-03-08 | Disposition: A | Discharge status: Home / Self Care | Payer: Managed Care, Other (non HMO) | Source: Home / Self Care | Attending: Family Medicine | Admitting: Family Medicine

## 2015-03-08 DIAGNOSIS — R69 Illness, unspecified: Principal | ICD-10-CM

## 2015-03-08 DIAGNOSIS — J111 Influenza due to unidentified influenza virus with other respiratory manifestations: Secondary | ICD-10-CM | POA: Diagnosis not present

## 2015-03-08 NOTE — ED Provider Notes (Signed)
CSN: [id number]     Arrival date & time 03/08/15  1402 History   First MD Initiated Contact with Patient 03/08/15 1656     Chief Complaint  Patient presents with  . Influenza   (Consider location/radiation/quality/duration/timing/severity/associated sxs/prior Treatment) Patient is a 30 y.o. female presenting with flu symptoms. The history is provided by the patient.  Influenza Presenting symptoms: fever, myalgias and nausea   Presenting symptoms: no diarrhea, no rhinorrhea, no shortness of breath, no sore throat and no vomiting   Severity:  Mild Onset quality:  Sudden Duration:  3 days Chronicity:  New Ineffective treatments:  OTC medications Associated symptoms: chills, decreased appetite and decreased physical activity     Past Medical History  Diagnosis Date  . Sickle cell trait (HCC)   . Thrombocytopenia (HCC)   . Urinary tract infection   . Thalassemia    Past Surgical History  Procedure Laterality Date  . Induced abortion      x2  . Tubal ligation  02/09/2011    Procedure: POST PARTUM TUBAL LIGATION;  Surgeon: Janine LimboArthur V Stringer, MD;  Location: [facility];  Service: Gynecology;  Laterality: Bilateral;   Family History  Problem Relation Age of Onset  . Anesthesia problems Neg Hx    Social History  Substance Use Topics  . Smoking status: Current Every Day Smoker -- 0.50 packs/day  . Smokeless tobacco: Never Used     Comment: with preg  . Alcohol Use: 0.6 oz/week    1 Shots of liquor per week   OB History    Gravida Para Term Preterm AB TAB SAB Ectopic Multiple Living   5 3 3  2 2    3      Review of Systems  Constitutional: Positive for fever, chills and decreased appetite.  HENT: Negative.  Negative for rhinorrhea and sore throat.   Respiratory: Negative.  Negative for shortness of breath.   Cardiovascular: Negative.   Gastrointestinal: Positive for nausea. Negative for vomiting and diarrhea.  Musculoskeletal: Positive for myalgias.  Skin: Negative.   All other  systems reviewed and are negative.   Allergies  Review of patient's allergies indicates no known allergies.  Home Medications   Prior to Admission medications   Medication Sig Start Date End Date Taking? Authorizing Provider  acetaminophen (TYLENOL) 500 MG tablet Take 500 mg by mouth every 6 (six) hours as needed for mild pain.    Historical Provider, MD  ibuprofen (ADVIL,MOTRIN) 200 MG tablet Take 200 mg by mouth every 6 (six) hours as needed for moderate pain.    Historical Provider, MD  ibuprofen (ADVIL,MOTRIN) 800 MG tablet Take 1 tablet (800 mg total) by mouth 3 (three) times daily. 07/29/13   Dahlia ClientHannah Muthersbaugh, PA-C   Meds Ordered and Administered this Visit  Medications - No data to display  BP 138/87 mmHg  Pulse 87  Temp(Src) 98.6 F (37 C) (Oral)  Resp 18  SpO2 100%  LMP 03/03/2015 No data found.   Physical Exam  Constitutional: She is oriented to person, place, and time. She appears well-developed and well-nourished. No distress.  HENT:  Head: Normocephalic.  Right Ear: External ear normal.  Left Ear: External ear normal.  Mouth/Throat: Oropharynx is clear and moist.  Eyes: Pupils are equal, round, and reactive to light.  Neck: Normal range of motion. Neck supple.  Cardiovascular: Normal rate, regular rhythm, normal heart sounds and intact distal pulses.   Pulmonary/Chest: Effort normal and breath sounds normal.  Abdominal: Soft. Bowel sounds are normal.  There is no tenderness. There is no rebound.  Lymphadenopathy:    She has no cervical adenopathy.  Neurological: She is alert and oriented to person, place, and time.  Skin: Skin is warm and dry.  Nursing note and vitals reviewed.   ED Course  Procedures (including critical care time)  Labs Review Labs Reviewed - No data to display  Imaging Review No results found.   Visual Acuity Review  Right Eye Distance:   Left Eye Distance:   Bilateral Distance:    Right Eye Near:   Left Eye Near:     Bilateral Near:         MDM   1. Influenza-like illness        Linna Hoff, MD 03/08/15 [phone]

## 2015-03-08 NOTE — ED Notes (Signed)
Pt  Reports  Symptoms   Of  Body   Aches     Chills   Coughing    With  Symptoms     X  2  Days     Pt  Reports  The  Symptoms  Not  releived  By  otc meds      Such  As  theraflu

## 2015-03-09 NOTE — ED Notes (Signed)
Pt seen  Yesterday  For   The  Flu     Still  having   Symptoms    Reports    Needs   A  Note       For  Another    Day     Dr  Artis Flockkindl   [facility]

## 2015-06-01 ENCOUNTER — Ambulatory Visit (HOSPITAL_COMMUNITY)
Admission: EM | Admit: 2015-06-01 | Discharge: 2015-06-01 | Disposition: A | Discharge status: Home / Self Care | Payer: Managed Care, Other (non HMO) | Attending: Family Medicine | Admitting: Family Medicine

## 2015-06-01 ENCOUNTER — Encounter (HOSPITAL_COMMUNITY): Payer: Self-pay | Admitting: Emergency Medicine

## 2015-06-01 DIAGNOSIS — J029 Acute pharyngitis, unspecified: Secondary | ICD-10-CM | POA: Insufficient documentation

## 2015-06-01 DIAGNOSIS — F17210 Nicotine dependence, cigarettes, uncomplicated: Secondary | ICD-10-CM | POA: Insufficient documentation

## 2015-06-01 DIAGNOSIS — D573 Sickle-cell trait: Secondary | ICD-10-CM | POA: Insufficient documentation

## 2015-06-01 LAB — POCT RAPID STREP A: STREPTOCOCCUS, GROUP A SCREEN (DIRECT): NEGATIVE

## 2015-06-01 MED ORDER — AMOXICILLIN 500 MG PO CAPS: 500.0000 mg | ORAL_CAPSULE | Freq: Three times a day (TID) | ORAL | Status: DC

## 2015-06-01 NOTE — ED Notes (Signed)
PT discharged by Frank Patrick, PA 

## 2015-06-01 NOTE — Discharge Instructions (Signed)

## 2015-06-01 NOTE — ED Provider Notes (Signed)
CSN: [id number]     Arrival date & time 06/01/15  1700 History   First MD Initiated Contact with Patient 06/01/15 1756     Chief Complaint  Patient presents with  . Sore Throat   (Consider location/radiation/quality/duration/timing/severity/associated sxs/prior Treatment) HPI History obtained from patient:  Pt presents with the cc of:  Sore throat congestion Duration of symptoms: 2 days Treatment prior to arrival: Over-the-counter medications Context: Sudden is positive for strep throat second course of antibiotics other states that she has been drinking after him. Other symptoms include: Low-grade temperature Pain score: 2 FAMILY HISTORY: No family history of diabetes    Past Medical History  Diagnosis Date  . Sickle cell trait (HCC)   . Thrombocytopenia (HCC)   . Urinary tract infection   . Thalassemia    Past Surgical History  Procedure Laterality Date  . Induced abortion      x2  . Tubal ligation  02/09/2011    Procedure: POST PARTUM TUBAL LIGATION;  Surgeon: Janine Limbo, MD;  Location: [facility];  Service: Gynecology;  Laterality: Bilateral;   Family History  Problem Relation Age of Onset  . Anesthesia problems Neg Hx    Social History  Substance Use Topics  . Smoking status: Current Every Day Smoker -- 0.50 packs/day  . Smokeless tobacco: Never Used     Comment: with preg  . Alcohol Use: No   OB History    Gravida Para Term Preterm AB TAB SAB Ectopic Multiple Living   Review of Systems  Denies: HEADACHE, NAUSEA, ABDOMINAL PAIN, CHEST PAIN, CONGESTION, DYSURIA, SHORTNESS OF BREATH  Allergies  Review of patient's allergies indicates no known allergies.  Home Medications   Prior to Admission medications   Medication Sig Start Date End Date Taking? Authorizing Provider  acetaminophen (TYLENOL) 500 MG tablet Take 500 mg by mouth every 6 (six) hours as needed for mild pain.    Historical Provider, MD  amoxicillin (AMOXIL) 500 MG  capsule Take 1 capsule (500 mg total) by mouth 3 (three) times daily. 06/01/15   Tharon Aquas, PA  ibuprofen (ADVIL,MOTRIN) 200 MG tablet Take 200 mg by mouth every 6 (six) hours as needed for moderate pain.    Historical Provider, MD  ibuprofen (ADVIL,MOTRIN) 800 MG tablet Take 1 tablet (800 mg total) by mouth 3 (three) times daily. 07/29/13   Dahlia Client Muthersbaugh, PA-C   Meds Ordered and Administered this Visit  Medications - No data to display  BP 135/87 mmHg  Pulse 91  Temp(Src) 99.1 F (37.3 C)  Resp 12  SpO2 100%  LMP 06/01/2015 No data found.   Physical Exam NURSES NOTES AND VITAL SIGNS REVIEWED. CONSTITUTIONAL: Well developed, well nourished, no acute distress HEENT: normocephalic, atraumatic EYES: Conjunctiva normal NECK:normal ROM, supple, no adenopathy PULMONARY:No respiratory distress, normal effort ABDOMINAL: Soft, ND, NT BS+, No CVAT MUSCULOSKELETAL: Normal ROM of all extremities,  SKIN: warm and dry without rash PSYCHIATRIC: Mood and affect, behavior are normal  ED Course  Procedures (including critical care time)  Labs Review Labs Reviewed  POCT RAPID STREP A  Reviewed for MDM and reviewed with patient  Imaging Review No results found.   Visual Acuity Review  Right Eye Distance:   Left Eye Distance:   Bilateral Distance:    Right Eye Near:   Left Eye Near:    Bilateral Near:     Prescription for amoxicillin  MDM   1. Sore throat    Patient is reassured that there are no issues that require transfer to higher level of care at this time or additional tests. Patient is advised to continue home symptomatic treatment. Patient is advised that if there are new or worsening symptoms to attend the emergency department, contact primary care provider, or return to UC. Instructions of care provided discharged home in stable condition.    THIS NOTE WAS GENERATED USING A VOICE RECOGNITION SOFTWARE PROGRAM. ALL REASONABLE EFFORTS  WERE MADE TO  PROOFREAD THIS DOCUMENT FOR ACCURACY.  I have verbally reviewed the discharge instructions with the patient. A printed AVS was given to the patient.  All questions were answered prior to discharge.      Tharon AquasFrank C Shawntae Lowy, PA 06/01/15 Paulo [id number]

## 2015-06-01 NOTE — ED Notes (Signed)
PT reports her son has strep. Sunday night PT did not feel well and Monday morning PT had a bad headache, sore throat, and feels like her ears are "draining." PT's son is on his second course of antibioticis to treat strep

## 2015-06-04 LAB — CULTURE, GROUP A STREP (THRC)

## 2015-11-15 ENCOUNTER — Emergency Department (HOSPITAL_COMMUNITY)
Admission: EM | Admit: 2015-11-15 | Discharge: 2015-11-16 | Disposition: A | Discharge status: Home / Self Care | Payer: Self-pay | Attending: Emergency Medicine | Admitting: Emergency Medicine

## 2015-11-15 ENCOUNTER — Emergency Department (HOSPITAL_COMMUNITY): Payer: Self-pay

## 2015-11-15 ENCOUNTER — Encounter (HOSPITAL_COMMUNITY): Payer: Self-pay

## 2015-11-15 DIAGNOSIS — M25532 Pain in left wrist: Secondary | ICD-10-CM | POA: Insufficient documentation

## 2015-11-15 DIAGNOSIS — M25531 Pain in right wrist: Secondary | ICD-10-CM | POA: Insufficient documentation

## 2015-11-15 DIAGNOSIS — F17200 Nicotine dependence, unspecified, uncomplicated: Secondary | ICD-10-CM | POA: Insufficient documentation

## 2015-11-15 MED ORDER — MELOXICAM 15 MG PO TABS: 15.0000 mg | ORAL_TABLET | Freq: Every day | ORAL | 0 refills | Status: DC

## 2015-11-15 NOTE — Progress Notes (Signed)
Orthopedic Tech Progress Note Patient Details:  Erin EmmsCharliene J Archer 12-Apr-1985 [id number]  Ortho Devices Type of Ortho Device: Velcro wrist splint Ortho Device/Splint Location: lue Ortho Device/Splint Interventions: Ordered, Application   [name], [name] 11/15/2015, 11:48 PM

## 2015-11-15 NOTE — ED Triage Notes (Signed)
Pt states for months she has had bilateral wrist pain that wakes her up at night and gives her problems while working; pt is a CNA; pt c/o pain 6/10 on arrival. Pt a&ox 4 on arrival.

## 2015-11-15 NOTE — Discharge Instructions (Signed)
Mobic daily for inflammation and pain. Keep both splints on your arms at all times. Avoid lifting greater than 5lb. Follow up with a family doctor.

## 2015-11-15 NOTE — ED Provider Notes (Signed)
[facility]-EMERGENCY DEPT Provider Note   CSN: [id number] Arrival date & time: 11/15/15  2117  By signing my name below, I, Erin Archer, attest that this documentation has been prepared under the direction and in the presence of  Erin Sias, PA-C. Electronically Signed: Clovis PuAvnee Archer, ED Scribe. 11/15/15. 11:30 PM.   History   Chief Complaint Chief Complaint  Patient presents with  . Wrist Pain   The history is provided by the patient. No language interpreter was used.   HPI Comments:  Erin Archer is a [age] y.o. female, with a hx of carpal tunnel, who presents to the Emergency Department complaining of gradually worsening bilateral hand and wrist pain with associated intermittent episodes of tingling/numbness to her hands x 5-6 months. 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 No alleviating or exacerbating factors noted. Pt denies any other symptoms or complaints at this time.  Past Medical History:  Diagnosis Date  . Sickle cell trait (HCC)   . Thalassemia   . Thrombocytopenia (HCC)   . Urinary tract infection     Patient Active Problem List   Diagnosis Date Noted  . Gastroenteritis 04/13/2013  . Nausea vomiting and diarrhea 04/12/2013  . Dehydration 04/12/2013  . Metabolic acidosis 04/12/2013  . 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 Late prenatal care 02/08/2011  . History of pyelonephritis 02/08/2011  . NSVD (normal spontaneous vaginal delivery) 02/08/2011  . Sickle cell trait (HCC)   . CARPAL TUNNEL SYNDROME, BILATERAL 06/05/2008  . ANEMIA, GESTATIONAL 06/05/2008  . IMPAIRED GLUCOSE TOLERANCE 05/04/2008    Past Surgical History:  Procedure Laterality Date  . INDUCED ABORTION     x2  . TUBAL LIGATION  02/09/2011   Procedure: POST PARTUM TUBAL LIGATION;  Surgeon: Janine LimboArthur V Stringer, MD;  Location: [facility];  Service:  Gynecology;  Laterality: Bilateral;    OB History    Gravida Para Term Preterm AB Living   5 3 3   2 3    SAB TAB Ectopic Multiple Live Births     2     3       Home Medications    Prior to Admission medications   Medication Sig Start Date End Date Taking? Authorizing Provider  acetaminophen (TYLENOL) 500 MG tablet Take 500 mg by mouth every 6 (six) hours as needed for mild pain.    Historical Provider, MD  amoxicillin (AMOXIL) 500 MG capsule Take 1 capsule (500 mg total) by mouth 3 (three) times daily. 06/01/15   Erin AquasFrank C Patrick, PA  ibuprofen (ADVIL,MOTRIN) 200 MG tablet Take 200 mg by mouth every 6 (six) hours as needed for moderate pain.    Historical Provider, MD  ibuprofen (ADVIL,MOTRIN) 800 MG tablet Take 1 tablet (800 mg total) by mouth 3 (three) times daily. 07/29/13   Erin ClientHannah Muthersbaugh, PA-C    Family History Family History  Problem Relation Age of Onset  . Anesthesia problems Neg Hx     Social History Social History  Substance Use Topics  . Smoking status: Current Every Day Smoker    Packs/day: 0.50  . Smokeless tobacco: Never Used     Comment: with preg  . Alcohol use No     Allergies   Patient has no known allergies.   Review of Systems Review of Systems  Musculoskeletal: Positive for  myalgias.  Neurological: Positive for numbness.     Physical Exam Updated Vital Signs BP 132/77 (BP Location: Left Arm)   Pulse 86   Temp 98.3 F (36.8 C) (Oral)   Resp 16   Ht 5\' 5"  (1.651 m)   Wt 150 lb (68 kg)   LMP 10/22/2015 (Approximate)   SpO2 99%   BMI 24.96 kg/m   Physical Exam  Constitutional: She is oriented to person, place, and time. She appears well-developed and well-nourished. No distress.  HENT:  Head: Normocephalic and atraumatic.  Eyes: Conjunctivae are normal.  Cardiovascular: Normal rate.   Pulmonary/Chest: Effort normal.  Abdominal: She exhibits no distension.  Musculoskeletal:  Normal-appearing bilateral wrists. Diffuse  tenderness palpation to bilateral wrists joints diffusely. Pain with any range of motion at the wrist joint. Full range of motion of the wrist joints. Distal radial pulses intact and equal bilaterally. Negative Phalen's test. Hand exam is normal bilaterally. Cap refill <2 sec distally   Neurological: She is alert and oriented to person, place, and time.  Skin: Skin is warm and dry.  Psychiatric: She has a normal mood and affect.  Nursing note and vitals reviewed.    ED Treatments / Results  DIAGNOSTIC STUDIES:  Oxygen Saturation is 99% on RA, normal by my interpretation.    COORDINATION OF CARE:  11:28 PM Discussed treatment plan with pt at bedside and pt agreed to plan.  Labs (all labs ordered are listed, but only abnormal results are displayed) Labs Reviewed - No data to display  EKG  EKG Interpretation None       Radiology Dg Wrist Complete Left  Result Date: 11/15/2015 CLINICAL DATA:  Initial evaluation for acute pain in bilateral wrists for 2-3 months. EXAM: LEFT WRIST - COMPLETE 3+ VIEW COMPARISON:  None. FINDINGS: There is no evidence of fracture or dislocation. There is no evidence of arthropathy or other focal bone abnormality. Soft tissues are unremarkable. IMPRESSION: No radiographic evidence for acute abnormality about the left wrist. Electronically Signed   By: Erin Archer  McClintock M.D.   On: 11/15/2015 22:21   Dg Wrist Complete Right  Result Date: 11/15/2015 CLINICAL DATA:  Nontraumatic right wrist pain for several months. EXAM: RIGHT WRIST - COMPLETE 3+ VIEW COMPARISON:  None. FINDINGS: There is no evidence of fracture or dislocation. There is no evidence of arthropathy or other focal bone abnormality. Mild widening of the scapholunate interval may represent a scapholunate dissociation. IMPRESSION: No fracture or other focal bone abnormality. Question scapholunate dissociation. MRI of the wrist would be definitive if additional imaging is clinically warranted.  Electronically Signed   By: Ellery Plunkaniel R Mitchell M.D.   On: 11/15/2015 22:21    Procedures Procedures (including critical care time)  Medications Ordered in ED Medications - No data to display   Initial Impression / Assessment and Plan / ED Course  I have reviewed the triage vital signs and the nursing notes.  Pertinent labs & imaging results that were available during my care of the patient were reviewed by me and considered in my medical decision making (see chart for details).  Clinical Course    Patient with bilateral wrist pain for "months." Exam is most concerning for carpal tunnel, possibly tendinitis. She has not had any acute injuries, x-rays negative, I do not suspect that she has a scapholunate dissociation given she has had no injuries. I will place her in bilateral wrist splints, she will wear them 24/7, follow-up with a hand surgeon.  Vitals:   11/15/15  2135  BP: 132/77  Pulse: 86  Resp: 16  Temp: 98.3 F (36.8 C)  TempSrc: Oral  SpO2: 99%  Weight: 68 kg  Height: 5\' 5"  (1.651 m)     Final Clinical Impressions(s) / ED Diagnoses   Final diagnoses:  Pain in both wrists    New Prescriptions New Prescriptions   No medications on file   I personally performed the services described in this documentation, which was scribed in my presence. The recorded information has been reviewed and is accurate.     Jaynie Crumble, PA-C 11/16/15 0157    Gerhard Munch, MD 11/17/15 [phone]

## 2018-10-27 ENCOUNTER — Other Ambulatory Visit: Payer: Self-pay

## 2018-10-27 ENCOUNTER — Emergency Department (HOSPITAL_COMMUNITY)
Admission: EM | Admit: 2018-10-27 | Discharge: 2018-10-28 | Disposition: A | Discharge status: Home / Self Care | Payer: Self-pay | Attending: Emergency Medicine | Admitting: Emergency Medicine

## 2018-10-27 ENCOUNTER — Encounter (HOSPITAL_COMMUNITY): Payer: Self-pay | Admitting: Emergency Medicine

## 2018-10-27 DIAGNOSIS — Z79899 Other long term (current) drug therapy: Secondary | ICD-10-CM | POA: Insufficient documentation

## 2018-10-27 DIAGNOSIS — N12 Tubulo-interstitial nephritis, not specified as acute or chronic: Secondary | ICD-10-CM | POA: Insufficient documentation

## 2018-10-27 DIAGNOSIS — F17210 Nicotine dependence, cigarettes, uncomplicated: Secondary | ICD-10-CM | POA: Insufficient documentation

## 2018-10-27 DIAGNOSIS — Z791 Long term (current) use of non-steroidal anti-inflammatories (NSAID): Secondary | ICD-10-CM | POA: Insufficient documentation

## 2018-10-27 LAB — BASIC METABOLIC PANEL
Anion gap: 8 (ref 5–15)
BUN: 9 mg/dL (ref 6–20)
CO2: 25 mmol/L (ref 22–32)
Calcium: 9.1 mg/dL (ref 8.9–10.3)
Chloride: 106 mmol/L (ref 98–111)
Creatinine, Ser: 0.73 mg/dL (ref 0.44–1.00)
GFR calc Af Amer: >60 mL/min (ref >60)
GFR calc non Af Amer: >60 mL/min (ref >60)
Glucose, Bld: 107 mg/dL — ABNORMAL HIGH (ref 70–99)
Potassium: 4.2 mmol/L (ref 3.5–5.1)
Sodium: 139 mmol/L (ref 135–145)

## 2018-10-27 LAB — CBC
HCT: 31.3 % — ABNORMAL LOW (ref 36.0–46.0)
Hemoglobin: 10.9 g/dL — ABNORMAL LOW (ref 12.0–15.0)
MCH: 21.9 pg — ABNORMAL LOW (ref 26.0–34.0)
MCHC: 34.8 g/dL (ref 30.0–36.0)
MCV: 62.9 fL — ABNORMAL LOW (ref 80.0–100.0)
Platelets: 146 10*3/uL — ABNORMAL LOW (ref 150–400)
RBC: 4.98 MIL/uL (ref 3.87–5.11)
RDW: 17.7 % — ABNORMAL HIGH (ref 11.5–15.5)
WBC: 10.7 10*3/uL — ABNORMAL HIGH (ref 4.0–10.5)
nRBC: 0 % (ref 0.0–0.2)

## 2018-10-27 LAB — URINALYSIS, ROUTINE W REFLEX MICROSCOPIC
Bacteria, UA: NONE SEEN
Bilirubin Urine: NEGATIVE
Glucose, UA: NEGATIVE mg/dL
Ketones, ur: NEGATIVE mg/dL
Nitrite: NEGATIVE
Protein, ur: 100 mg/dL — AB
RBC / HPF: >50 RBC/hpf — ABNORMAL HIGH (ref 0–5)
Specific Gravity, Urine: 1.012 (ref 1.005–1.030)
pH: 6 (ref 5.0–8.0)

## 2018-10-27 LAB — I-STAT BETA HCG BLOOD, ED (MC, WL, AP ONLY): I-stat hCG, quantitative: <5 m[IU]/mL (ref <5)

## 2018-10-27 MED ORDER — PHENAZOPYRIDINE HCL 200 MG PO TABS: 200.0000 mg | ORAL_TABLET | Freq: Three times a day (TID) | ORAL | 0 refills | Status: DC | PRN

## 2018-10-27 MED ORDER — CEPHALEXIN 500 MG PO CAPS: 500.0000 mg | ORAL_CAPSULE | Freq: Four times a day (QID) | ORAL | 0 refills | Status: AC

## 2018-10-27 MED ORDER — CEPHALEXIN 500 MG PO CAPS
500.0000 mg | ORAL_CAPSULE | Freq: Once | ORAL | Status: AC
  Administered 2018-10-28: 500 mg via ORAL
  Filled 2018-10-27: qty 1

## 2018-10-27 NOTE — Discharge Instructions (Signed)
1. Medications: keflex, pyridium, usual home medications 2. Treatment: rest, drink plenty of fluids, take medications as prescribed 3. Follow Up: Please followup with your primary doctor in 3 days for discussion of your diagnoses and further evaluation after today's visit; if you do not have a primary care doctor use the resource guide provided to find one; return to the ER for fevers, persistent vomiting, worsening abdominal pain or other concerning symptoms.

## 2018-10-27 NOTE — ED Provider Notes (Signed)
[facility]-EMERGENCY DEPT Provider Note   CSN: [id number] Arrival date & time: 10/27/18  2032     History   Chief Complaint Chief Complaint  Patient presents with  . Flank Pain  . Urinary Frequency    HPI Erin Archer is a 33 y.o. female with a hx of sickle cell trait, thalassemia, thrombocytopenia, recurrent urinary tract infection presents to the Emergency Department complaining of gradual, persistent, progressively worsening prepubic abdominal pain, bilateral flank pain onset 4 days ago.  Patient reports associated hematuria, dysuria and urinary urgency.  She reports history of UTI and this feels similar.  She also has a history of pyelonephritis but no history of nephrolithiasis.  No treatments prior to arrival.  Symptoms make her symptoms better or worse.  She denies fever, chills, nausea, vomiting, weakness, dizziness, syncope.     The history is provided by the patient and medical records. No language interpreter was used.    Past Medical History:  Diagnosis Date  . Sickle cell trait (HCC)   . Thalassemia   . Thrombocytopenia (HCC)   . Urinary tract infection     Patient Active Problem List   Diagnosis Date Noted  . Gastroenteritis 04/13/2013  . Nausea vomiting and diarrhea 04/12/2013  . Dehydration 04/12/2013  . Metabolic acidosis 04/12/2013  . 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 Late prenatal care 02/08/2011  . History of pyelonephritis 02/08/2011  . NSVD (normal spontaneous vaginal delivery) 02/08/2011  . Sickle cell trait (HCC)   . CARPAL TUNNEL SYNDROME, BILATERAL 06/05/2008  . ANEMIA, GESTATIONAL 06/05/2008  . IMPAIRED GLUCOSE TOLERANCE 05/04/2008    Past Surgical History:  Procedure Laterality Date  . INDUCED ABORTION     x2  . TUBAL LIGATION  02/09/2011   Procedure: POST PARTUM TUBAL LIGATION;  Surgeon: Janine Limbo, MD;  Location: [facility];  Service: Gynecology;  Laterality: Bilateral;     OB History    Gravida  5    Para  3   Term  3   Preterm      AB  2   Living  3     SAB      TAB  2   Ectopic      Multiple      Live Births  3            Home Medications    Prior to Admission medications   Medication Sig Start Date End Date Taking? Authorizing Provider  acetaminophen (TYLENOL) 500 MG tablet Take 500 mg by mouth every 6 (six) hours as needed for mild pain.    [provider]  amoxicillin (AMOXIL) 500 MG capsule Take 1 capsule (500 mg total) by mouth 3 (three) times daily. 06/01/15   Tharon Aquas, PA  cephALEXin (KEFLEX) 500 MG capsule Take 1 capsule (500 mg total) by mouth 4 (four) times daily for 7 days. 10/27/18 11/03/18  Del Overfelt, Dahlia Client, PA-C  ibuprofen (ADVIL,MOTRIN) 200 MG tablet Take 200 mg by mouth every 6 (six) hours as needed for moderate pain.    [provider]  ibuprofen (ADVIL,MOTRIN) 800 MG tablet Take 1 tablet (800 mg total) by mouth 3 (three) times daily. 07/29/13   Joydan Gretzinger, Dahlia Client, PA-C  meloxicam (MOBIC) 15 MG tablet Take 1 tablet (15 mg total) by mouth daily. 11/15/15   Kirichenko, Lemont Fillers, PA-C  phenazopyridine (PYRIDIUM) 200 MG tablet Take 1 tablet (200 mg total) by mouth 3 (three) times daily as needed for pain. 10/27/18   Kyo Cocuzza, Dahlia Client,  PA-C    Family History Family History  Problem Relation Age of Onset  . Anesthesia problems Neg Hx     Social History Social History   Tobacco Use  . Smoking status: Current Every Day Smoker    Packs/day: 0.50  . Smokeless tobacco: Never Used  . Tobacco comment: with preg  Substance Use Topics  . Alcohol use: No    Alcohol/week: 1.0 standard drinks    Types: 1 Shots of liquor per week  . Drug use: Yes    Types: Marijuana    Comment: not with preg     Allergies   Patient has no known allergies.   Review of Systems Review of Systems  Constitutional: Negative for appetite change, diaphoresis, fatigue, fever and unexpected weight change.  HENT: Negative for mouth  sores.   Eyes: Negative for visual disturbance.  Respiratory: Negative for cough, chest tightness, shortness of breath and wheezing.   Cardiovascular: Negative for chest pain.  Gastrointestinal: Negative for abdominal pain, constipation, diarrhea, nausea and vomiting.  Endocrine: Negative for polydipsia, polyphagia and polyuria.  Genitourinary: Positive for dysuria, flank pain, frequency, hematuria and urgency.  Musculoskeletal: Positive for back pain. Negative for neck stiffness.  Skin: Negative for rash.  Allergic/Immunologic: Negative for immunocompromised state.  Neurological: Negative for syncope, light-headedness and headaches.  Hematological: Does not bruise/bleed easily.  Psychiatric/Behavioral: Negative for sleep disturbance. The patient is not nervous/anxious.      Physical Exam Updated Vital Signs BP (!) 148/88 (BP Location: Left Arm)   Pulse 86   Temp 98.3 F (36.8 C) (Oral)   Resp 16   Ht 5\' 5"  (1.651 m)   Wt 65.8 kg   LMP 10/14/2018   SpO2 98%   BMI 24.13 kg/m   Physical Exam Vitals signs and nursing note reviewed.  Constitutional:      General: She is not in acute distress.    Appearance: She is not diaphoretic.  HENT:     Head: Normocephalic.  Eyes:     General: No scleral icterus.    Conjunctiva/sclera: Conjunctivae normal.  Neck:     Musculoskeletal: Normal range of motion.  Cardiovascular:     Rate and Rhythm: Normal rate and regular rhythm.     Pulses: Normal pulses.          Radial pulses are 2+ on the right side and 2+ on the left side.  Pulmonary:     Effort: No tachypnea, accessory muscle usage, prolonged expiration, respiratory distress or retractions.     Breath sounds: No stridor.     Comments: Equal chest rise. No increased work of breathing. Abdominal:     General: There is no distension.     Palpations: Abdomen is soft.     Tenderness: There is abdominal tenderness in the suprapubic area. There is no right CVA tenderness, left CVA  tenderness, guarding or rebound.     Comments: No CVA tenderness Very mild suprapubic tenderness  Musculoskeletal:     Comments: Moves all extremities equally and without difficulty.  Skin:    General: Skin is warm and dry.     Capillary Refill: Capillary refill takes less than 2 seconds.  Neurological:     Mental Status: She is alert.     GCS: GCS eye subscore is 4. GCS verbal subscore is 5. GCS motor subscore is 6.     Comments: Speech is clear and goal oriented.  Psychiatric:        Mood and Affect: Mood normal.  ED Treatments / Results  Labs (all labs ordered are listed, but only abnormal results are displayed) Labs Reviewed  URINALYSIS, ROUTINE W REFLEX MICROSCOPIC - Abnormal; Notable for the following components:      Result Value   APPearance CLOUDY (*)    Hgb urine dipstick LARGE (*)    Protein, ur 100 (*)    Leukocytes,Ua MODERATE (*)    RBC / HPF >50 (*)    All other components within normal limits  BASIC METABOLIC PANEL - Abnormal; Notable for the following components:   Glucose, Bld 107 (*)    All other components within normal limits  CBC - Abnormal; Notable for the following components:   WBC 10.7 (*)    Hemoglobin 10.9 (*)    HCT 31.3 (*)    MCV 62.9 (*)    MCH 21.9 (*)    RDW 17.7 (*)    Platelets 146 (*)    All other components within normal limits  URINE CULTURE  I-STAT BETA HCG BLOOD, ED ([facility], [facility], [facility] ONLY)    Procedures Procedures (including critical care time)  Medications Ordered in ED Medications  cephALEXin (KEFLEX) capsule 500 mg (has no administration in time range)     Initial Impression / Assessment and Plan / ED Course  I have reviewed the triage vital signs and the nursing notes.  Pertinent labs & imaging results that were available during my care of the patient were reviewed by me and considered in my medical decision making (see chart for details).  Clinical Course as of Oct 26 2333  Wynelle LinkSun Oct 27, 2018  2319 noted  WBC(!):  10.7 [HM]  2319 afebrile  Temp: 98.3 F (36.8 C) [HM]  2320 Mild anemia appears to be baseline  Hemoglobin(!): 10.9 [HM]  2320 Somewhat improved from baseline  Platelets(!): 146 [HM]    Clinical Course User Index [HM] Halden Phegley, Dahlia ClientHannah, PA-C       Pt UA with hemoglobin, leukocytes and white blood cells.  No bacteria is seen however more than 50 RBCs per high-powered field.  Urine culture sent.  Suspect early pyelonephritis given flank pain but no CVA tenderness.  Leukocytosis is noted.  Other abnormalities appear to be baseline. Pt is afebrile, normotensive, and denies N/V. Pt to be dc home with antibiotics and instructions to follow up with PCP if symptoms persist.  Also discussed reasons to return immediately to the emergency department.  Patient states understanding and is in agreement with the plan.    Final Clinical Impressions(s) / ED Diagnoses   Final diagnoses:  Pyelonephritis    ED Discharge Orders         Ordered    cephALEXin (KEFLEX) 500 MG capsule  4 times daily     10/27/18 2333    phenazopyridine (PYRIDIUM) 200 MG tablet  3 times daily PRN     10/27/18 2334           Kairah Leoni, Dahlia ClientHannah, PA-C 10/28/18 [id number]    Cathren LaineSteinl, Kevin, MD 10/28/18 [phone]

## 2018-10-27 NOTE — ED Triage Notes (Signed)
Patient complaining of left lower back pain , burning with urinating, and urge to urinate. Patient states this started on Thursday.

## 2018-10-29 LAB — URINE CULTURE: Culture: >=100000 — AB

## 2018-10-30 ENCOUNTER — Telehealth: Payer: Self-pay

## 2018-10-30 NOTE — Telephone Encounter (Addendum)
Post ED Visit - Positive Culture Follow-up: Successful Patient Follow-Up  Culture assessed and recommendations reviewed by:  []  Elenor Quinones, Pharm.D. []  Heide Guile, Pharm.D., BCPS AQ-ID []  Parks Neptune, Pharm.D., BCPS []  Alycia Rossetti, Pharm.D., BCPS []  [place], Pharm.D., BCPS, AAHIVP []  Legrand Como, Pharm.D., BCPS, AAHIVP []  Salome Arnt, PharmD, BCPS []  Johnnette Gourd, PharmD, BCPS []  Hughes Better, PharmD, BCPS []  Leeroy Cha, PharmD Reuel Boom Pharm D Positive urine culture  []  Patient discharged without antimicrobial prescription and treatment is now indicated [x]  Organism is resistant to prescribed ED discharge antimicrobial []  Patient with positive blood cultures  Changes discussed with ED provider: [name] [facility] New antibiotic prescription Bactrim DS 1 BID x 10 days Called to [place]  Left VM  Contacted patient, date 10/30/2018, time 1222   Erin Archer, Carolynn Comment 10/30/2018, 12:20 PM

## 2019-02-03 ENCOUNTER — Other Ambulatory Visit: Payer: Self-pay

## 2019-02-03 ENCOUNTER — Emergency Department (HOSPITAL_COMMUNITY)
Admission: EM | Admit: 2019-02-03 | Discharge: 2019-02-03 | Disposition: A | Discharge status: Home / Self Care | Payer: Self-pay | Attending: Emergency Medicine | Admitting: Emergency Medicine

## 2019-02-03 ENCOUNTER — Encounter (HOSPITAL_COMMUNITY): Payer: Self-pay | Admitting: Emergency Medicine

## 2019-02-03 DIAGNOSIS — F17210 Nicotine dependence, cigarettes, uncomplicated: Secondary | ICD-10-CM | POA: Insufficient documentation

## 2019-02-03 DIAGNOSIS — R21 Rash and other nonspecific skin eruption: Secondary | ICD-10-CM | POA: Insufficient documentation

## 2019-02-03 DIAGNOSIS — F1210 Cannabis abuse, uncomplicated: Secondary | ICD-10-CM | POA: Insufficient documentation

## 2019-02-03 MED ORDER — CLOTRIMAZOLE 1 % EX CREA: TOPICAL_CREAM | CUTANEOUS | 0 refills | Status: DC

## 2019-02-03 NOTE — Discharge Instructions (Signed)
Take the prescribed medication as directed.  Keep are clean and dry. Follow-up with your primary care doctor. Return to the ED for new or worsening symptoms.

## 2019-02-03 NOTE — ED Triage Notes (Signed)
Pt reports rash to left arm that may be ringworm. Pt states rash has been there for the last week and half.

## 2019-02-03 NOTE — ED Notes (Signed)
Patient complaining of rash on lower left arm. It a penny sized and it is dry.

## 2019-02-03 NOTE — ED Provider Notes (Signed)
[name] DEPT Provider Note   CSN: [id number] Arrival date & time: 02/03/19  1920     History Chief Complaint  Patient presents with  . Rash    Erin Archer is a 34 y.o. female.  The history is provided by the patient and medical records.  Rash   34 y.o. F with hx of thrombocytopenia, presenting to the ED with rash of left forearm.  States this has been present for almost 2 weeks, unsure if it has changed at all.  Denies fever.  No changes in soaps, detergents, or other personal care products.  No new medications.  Patient has been keeping this bandaged pretty much all the time.  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  She has not tried any OTC creams.  Past Medical History:  Diagnosis Date  . Sickle cell trait ([place])   . Thalassemia   . Thrombocytopenia ([place])   . Urinary tract infection     Patient Active Problem List   Diagnosis Date Noted  . Gastroenteritis 04/13/2013  . Nausea vomiting and diarrhea 04/12/2013  . Dehydration 04/12/2013  . Metabolic acidosis 01/10/3233  . 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 Late prenatal care 02/08/2011  . History of pyelonephritis 02/08/2011  . NSVD (normal spontaneous vaginal delivery) 02/08/2011  . Sickle cell trait ([place])   . CARPAL TUNNEL SYNDROME, BILATERAL 06/05/2008  . ANEMIA, GESTATIONAL 06/05/2008  . IMPAIRED GLUCOSE TOLERANCE 05/04/2008    Past Surgical History:  Procedure Laterality Date  . INDUCED ABORTION     x2  . TUBAL LIGATION  02/09/2011   Procedure: POST PARTUM TUBAL LIGATION;  Surgeon: Eli Hose, MD;  Location: [facility];  Service: Gynecology;  Laterality: Bilateral;     OB History    Gravida  5   Para  3   Term  3   Preterm      AB  2   Living  3     SAB      TAB  2   Ectopic      Multiple      Live Births  3           Family History  Problem Relation Age of Onset  . Anesthesia problems Neg Hx      Social History   Tobacco Use  . Smoking status: Current Every Day Smoker    Packs/day: 0.25  . Smokeless tobacco: Never Used  . Tobacco comment: with preg  Substance Use Topics  . Alcohol use: No    Alcohol/week: 1.0 standard drinks    Types: 1 Shots of liquor per week  . Drug use: Yes    Types: Marijuana    Comment: not with preg    Home Medications Prior to Admission medications   Medication Sig Start Date End Date Taking? Authorizing Provider  acetaminophen (TYLENOL) 500 MG tablet Take 500 mg by mouth every 6 (six) hours as needed for mild pain.    [provider]  amoxicillin (AMOXIL) 500 MG capsule Take 1 capsule (500 mg total) by mouth 3 (three) times daily. 06/01/15   Konrad Felix, PA  ibuprofen (ADVIL,MOTRIN) 200 MG tablet Take 200 mg by mouth every 6 (six) hours as needed for moderate pain.    [provider]  ibuprofen (ADVIL,MOTRIN) 800 MG tablet Take 1 tablet (800 mg total) by mouth 3 (three) times daily. 07/29/13   Muthersbaugh, Jarrett Soho, PA-C  meloxicam (MOBIC) 15 MG tablet  Take 1 tablet (15 mg total) by mouth daily. 11/15/15   Kirichenko, Lemont Fillers, PA-C  phenazopyridine (PYRIDIUM) 200 MG tablet Take 1 tablet (200 mg total) by mouth 3 (three) times daily as needed for pain. 10/27/18   Muthersbaugh, Dahlia Client, PA-C    Allergies    Patient has no known allergies.  Review of Systems   Review of Systems  Skin: Positive for rash.  All other systems reviewed and are negative.   Physical Exam Updated Vital Signs BP 122/84 (BP Location: Right Arm)   Pulse 77   Temp 99.2 F (37.3 C) (Oral)   Resp 16   Ht 5\' 5"  (1.651 m)   Wt 65.8 kg   LMP 01/17/2019   SpO2 100%   BMI 24.13 kg/m   Physical Exam Vitals and nursing note reviewed.  Constitutional:      Appearance: She is well-developed.  HENT:     Head: Normocephalic and atraumatic.  Eyes:     Conjunctiva/sclera: Conjunctivae normal.     Pupils: Pupils are equal, round, and reactive to light.   Cardiovascular:     Rate and Rhythm: Normal rate and regular rhythm.     Heart sounds: Normal heart sounds.  Pulmonary:     Effort: Pulmonary effort is normal.     Breath sounds: Normal breath sounds.  Abdominal:     General: Bowel sounds are normal.     Palpations: Abdomen is soft.  Musculoskeletal:        General: Normal range of motion.     Cervical back: Normal range of motion.     Comments: Left forearm with area about the size of a nickel, there is an faint outer ring with more visible inner ring, central clearing, skin is dry and scaly No lesions on palms/soles, no surrounding erythema, induration, or signs of cellulitis  Skin:    General: Skin is warm and dry.  Neurological:     Mental Status: She is alert and oriented to person, place, and time.     ED Results / Procedures / Treatments   Labs (all labs ordered are listed, but only abnormal results are displayed) Labs Reviewed - No data to display  EKG None  Radiology No results found.  Procedures Procedures (including critical care time)  Medications Ordered in ED Medications - No data to display  ED Course  I have reviewed the triage vital signs and the nursing notes.  Pertinent labs & imaging results that were available during my care of the patient were reviewed by me and considered in my medical decision making (see chart for details).    MDM Rules/Calculators/A&P  34 year old female here with rash that has been present for almost 2 weeks.  Small area to left forearm about the size of a nickel, faint outer ring with more visible intervening and central clearing.  Skin is dry and scaling.  This may be ringworm.  Will start on Lotrimin cream and have her follow-up with primary care doctor.  Return here for any new/acute changes.  Final Clinical Impression(s) / ED Diagnoses Final diagnoses:  Rash    Rx / DC Orders ED Discharge Orders         Ordered    clotrimazole (LOTRIMIN) 1 % cream     02/03/19  2252           04/03/19, PA-C 02/03/19 2258    2259, MD 02/05/19 [id number]

## 2019-02-03 NOTE — ED Notes (Signed)
Patient requested an md note.

## 2021-02-07 ENCOUNTER — Encounter (HOSPITAL_COMMUNITY): Payer: Self-pay

## 2021-02-07 ENCOUNTER — Emergency Department (HOSPITAL_COMMUNITY): Payer: Self-pay

## 2021-02-07 ENCOUNTER — Other Ambulatory Visit: Payer: Self-pay

## 2021-02-07 ENCOUNTER — Emergency Department (HOSPITAL_COMMUNITY)
Admission: EM | Admit: 2021-02-07 | Discharge: 2021-02-07 | Disposition: A | Discharge status: Home / Self Care | Payer: Self-pay | Attending: Emergency Medicine | Admitting: Emergency Medicine

## 2021-02-07 DIAGNOSIS — M5417 Radiculopathy, lumbosacral region: Secondary | ICD-10-CM | POA: Insufficient documentation

## 2021-02-07 DIAGNOSIS — R209 Unspecified disturbances of skin sensation: Secondary | ICD-10-CM | POA: Insufficient documentation

## 2021-02-07 DIAGNOSIS — G5603 Carpal tunnel syndrome, bilateral upper limbs: Secondary | ICD-10-CM | POA: Insufficient documentation

## 2021-02-07 LAB — CBC WITH DIFFERENTIAL/PLATELET
Abs Immature Granulocytes: 0.01 10*3/uL (ref 0.00–0.07)
Basophils Absolute: 0 10*3/uL (ref 0.0–0.1)
Basophils Relative: 1 %
Eosinophils Absolute: 0.1 10*3/uL (ref 0.0–0.5)
Eosinophils Relative: 1 %
HCT: 29 % — ABNORMAL LOW (ref 36.0–46.0)
Hemoglobin: 10.1 g/dL — ABNORMAL LOW (ref 12.0–15.0)
Immature Granulocytes: 0 %
Lymphocytes Relative: 41 %
Lymphs Abs: 2.6 10*3/uL (ref 0.7–4.0)
MCH: 21.4 pg — ABNORMAL LOW (ref 26.0–34.0)
MCHC: 34.8 g/dL (ref 30.0–36.0)
MCV: 61.3 fL — ABNORMAL LOW (ref 80.0–100.0)
Monocytes Absolute: 0.7 10*3/uL (ref 0.1–1.0)
Monocytes Relative: 10 %
Neutro Abs: 2.9 10*3/uL (ref 1.7–7.7)
Neutrophils Relative %: 47 %
Platelets: 151 10*3/uL (ref 150–400)
RBC: 4.73 MIL/uL (ref 3.87–5.11)
RDW: 19 % — ABNORMAL HIGH (ref 11.5–15.5)
WBC: 6.3 10*3/uL (ref 4.0–10.5)
nRBC: 0 % (ref 0.0–0.2)

## 2021-02-07 LAB — BASIC METABOLIC PANEL
Anion gap: 8 (ref 5–15)
BUN: 13 mg/dL (ref 6–20)
CO2: 22 mmol/L (ref 22–32)
Calcium: 9 mg/dL (ref 8.9–10.3)
Chloride: 104 mmol/L (ref 98–111)
Creatinine, Ser: 0.51 mg/dL (ref 0.44–1.00)
GFR, Estimated: >60 mL/min (ref >60)
Glucose, Bld: 76 mg/dL (ref 70–99)
Potassium: 4.4 mmol/L (ref 3.5–5.1)
Sodium: 134 mmol/L — ABNORMAL LOW (ref 135–145)

## 2021-02-07 MED ORDER — PREDNISONE 10 MG (21) PO TBPK: ORAL_TABLET | Freq: Every day | ORAL | 0 refills | Status: DC

## 2021-02-07 NOTE — Discharge Instructions (Addendum)
Please pick up medication and take as prescribed  Follow up with [facility] for further evaluation of the tingling in your legs/back pain as you may require further testing.   You can also see them for your carpal tunnel syndrome. You can buy wrist braces OTC to wear during work that could help with the tingling in your hands.   Return to the ED IMMEDIATELY for any new/worsening symptoms including severe back pain, feeling like you are holding onto urine, peeing or pooping on yourself, numbness/complete loss of sensation in your groin, fevers, inability to ambulate/move your legs, or any other new/concerning symptoms.

## 2021-02-07 NOTE — ED Triage Notes (Addendum)
Patient c/o numbness and tingling of both arms and hands, knees to feet x 2 weeks. Patient states worse when ambulating. Patient denies heavy lifting, but states she walks a lot on her job.

## 2021-02-07 NOTE — ED Provider Notes (Signed)
[facility]-EMERGENCY DEPT Provider Note   CSN: [id number] Arrival date & time: 02/07/21  1010     History  Chief Complaint  Patient presents with   Numbness   Tingling    Erin Archer is a 36 y.o. female who presents to the ED today with complaint of intermittent paresthesias to BLEs for the past 2 weeks. Pt also complains of similar paresthesias to her bilateral hands/forearms however states that this is a chronic thing for her. Per chart review pt with hx of carpal tunnel syndrome. 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 Pt now concerned as she is having similar symptoms in her feet. She states that she first noticed the paresthesias when ambulating/on her feet for prolonged periods of time however now it is more frequent and not only with ambulation. She also complains of lower back pain that is present when laying flat. She states whenever she lays a certain way she feels like the tingling in her legs is also worse. She reports her boyfriend was massaging her legs/thighs the other day to help with her symptoms and she felt like her left inner thigh was more sensitive than usual/compared to the right thigh. She denies specific numbness in her groin. She states whenever her back pain is severe she feels like she is going to lose control of her bowels however has never done this. She denies any urinary retention.   The history is provided by the patient and medical records.      Home Medications Prior to Admission medications   Medication Sig Start Date End Date Taking? Authorizing Provider  acetaminophen (TYLENOL) 500 MG tablet Take 500 mg by mouth every 6 (six) hours as needed for mild pain.    [provider]  amoxicillin (AMOXIL) 500 MG capsule Take 1 capsule (500 mg total) by mouth 3 (three) times daily. 06/01/15   Tharon Aquas, PA  clotrimazole (LOTRIMIN) 1 % cream Apply to affected area 2 times daily 02/03/19   Garlon Hatchet,  PA-C  ibuprofen (ADVIL,MOTRIN) 200 MG tablet Take 200 mg by mouth every 6 (six) hours as needed for moderate pain.    [provider]  ibuprofen (ADVIL,MOTRIN) 800 MG tablet Take 1 tablet (800 mg total) by mouth 3 (three) times daily. 07/29/13   Muthersbaugh, Dahlia Client, PA-C  meloxicam (MOBIC) 15 MG tablet Take 1 tablet (15 mg total) by mouth daily. 11/15/15   Kirichenko, Lemont Fillers, PA-C  phenazopyridine (PYRIDIUM) 200 MG tablet Take 1 tablet (200 mg total) by mouth 3 (three) times daily as needed for pain. 10/27/18   Muthersbaugh, Dahlia Client, PA-C  predniSONE (STERAPRED UNI-PAK 21 TAB) 10 MG (21) TBPK tablet Take by mouth daily. Follow package insert 02/07/21  Yes Tanda Rockers, PA-C      Allergies    Patient has no known allergies.    Review of Systems   Review of Systems  Constitutional:  Negative for chills and fever.  Gastrointestinal:        No bowel incontinence  Genitourinary:  Negative for difficulty urinating.  Musculoskeletal:  Positive for back pain.  Neurological:        + paresthesias  All other systems reviewed and are negative.  Physical Exam Updated Vital Signs BP 125/80    Pulse 70    Temp 98.1 F (36.7 C) (Oral)    Resp 18    Ht 5\' 5"  (1.651 m)    Wt 65.8 kg    LMP  01/21/2021 (Approximate)    SpO2 99%    BMI 24.13 kg/m  Physical Exam Vitals and nursing note reviewed.  Constitutional:      Appearance: She is not ill-appearing.  HENT:     Head: Normocephalic and atraumatic.  Eyes:     Conjunctiva/sclera: Conjunctivae normal.  Cardiovascular:     Rate and Rhythm: Normal rate and regular rhythm.     Pulses: Normal pulses.  Pulmonary:     Effort: Pulmonary effort is normal.     Breath sounds: Normal breath sounds.  Musculoskeletal:     Comments: No C or T midline spinal TTP. No specific TTP to midline L spine however pt complains of a "pressure" sensation to her back with palpation. Moving all extremities without difficulty. Strength 5/5 to BUE and BLEs.  Sensation intact to dull and sharp touch throughout BLEs. 2+ DP pulses bilaterally. 2+ patellar reflexes  + Phalen's test  Skin:    General: Skin is warm and dry.     Coloration: Skin is not jaundiced.  Neurological:     Mental Status: She is alert.    ED Results / Procedures / Treatments   Labs (all labs ordered are listed, but only abnormal results are displayed) Labs Reviewed  BASIC METABOLIC PANEL - Abnormal; Notable for the following components:      Result Value   Sodium 134 (*)    All other components within normal limits  CBC WITH DIFFERENTIAL/PLATELET - Abnormal; Notable for the following components:   Hemoglobin 10.1 (*)    HCT 29.0 (*)    MCV 61.3 (*)    MCH 21.4 (*)    RDW 19.0 (*)    All other components within normal limits    EKG None  Radiology DG Lumbar Spine Complete  Result Date: 02/07/2021 CLINICAL DATA:  Radicular low back pain, tingling in knees to feet, no history of trauma EXAM: LUMBAR SPINE - COMPLETE 4+ VIEW COMPARISON:  None FINDINGS: Five non-rib-bearing lumbar vertebra with partial sacralization of the RIGHT transverse process of L5. Minimal dextroconvex lumbar scoliosis. Vertebral body and disc space heights maintained. No fracture, subluxation, or bone destruction. Osseous mineralization normal. IMPRESSION: No acute abnormalities. Minimal dextroconvex scoliosis. Electronically Signed   By: Ulyses Southward M.D.   On: 02/07/2021 12:11    Procedures Procedures    Medications Ordered in ED Medications - No data to display  ED Course/ Medical Decision Making/ A&P                           Medical Decision Making 36 year old female who presents to the ED today with complaint of paresthesias to bilateral lower extremities x2 weeks as well as low back pain.  Mentions during triage she is also complaining of a tingling sensation to her bilateral arms however she mentions to me that this is chronic.  History of carpal tunnel syndrome.  On arrival to the  ED vitals are stable.  Patient appears to be no acute distress.  She is seen moving all extremities without difficulty.  She has equal strength and sensation to bilateral upper and bilateral lower extremities.  She is able to discern dull and sharp sensation to her bilateral lower extremities without difficulty.  She has no obvious midline spinal tenderness palpation however with palpating her lumbar spine she reports a "pressure" sensation in this area.  She does mention that at times when her back pain is more severe she feels like she  could have a bowel movement on herself however has never done so.  She denies any urinary retention. Denies any recent URI like sx to suggest GBS. Her symptoms do seem more consistent with paresthesias at this time and I have lower suspicion for cauda equina or spinal epidural abscess however we will plan for lab work and initial x-ray of her lumbar spine for further evaluation as well as post void residual. Discussed case with attending physician Dr. Wilkie Aye who agrees with plan.   Workup overall re-assuring. Post void residual 0 mLs. Pt ambulating without difficulty. Will discharge home at this time with ortho/spine follow up and Rx prednisone. Pt instructed on strict return precautions including severe back pain, urinary retention, urinary or bowel incontinence, saddle anesthesia, fevers, difficulty ambulating, or any other new/concerning symptoms.   Problems Addressed: Bilateral carpal tunnel syndrome: acute illness or injury Lumbosacral radiculopathy: acute illness or injury  Amount and/or Complexity of Data Reviewed Labs: ordered.    Details: CBC without leukocytosis. hgb stable at 10.1 with hx of anemia BMP without electrolyte abnormalities Radiology: ordered.    Details: Xray negative          Final Clinical Impression(s) / ED Diagnoses Final diagnoses:  Lumbosacral radiculopathy  Bilateral carpal tunnel syndrome    Rx / DC Orders ED Discharge  Orders          Ordered    predniSONE (STERAPRED UNI-PAK 21 TAB) 10 MG (21) TBPK tablet  Daily        02/07/21 1336             Discharge Instructions      Please pick up medication and take as prescribed  Follow up with [facility] for further evaluation of the tingling in your legs/back pain as you may require further testing.   You can also see them for your carpal tunnel syndrome. You can buy wrist braces OTC to wear during work that could help with the tingling in your hands.   Return to the ED IMMEDIATELY for any new/worsening symptoms including severe back pain, feeling like you are holding onto urine, peeing or pooping on yourself, numbness/complete loss of sensation in your groin, fevers, inability to ambulate/move your legs, or any other new/concerning symptoms.        Tanda Rockers, PA-C 02/07/21 1339    Rozelle Logan, DO 02/08/21 1038

## 2023-05-19 ENCOUNTER — Encounter (HOSPITAL_COMMUNITY): Payer: Self-pay | Admitting: *Deleted

## 2023-05-19 ENCOUNTER — Inpatient Hospital Stay (HOSPITAL_BASED_OUTPATIENT_CLINIC_OR_DEPARTMENT_OTHER): Payer: MEDICAID

## 2023-05-19 ENCOUNTER — Inpatient Hospital Stay (HOSPITAL_COMMUNITY)
Admission: AD | Admit: 2023-05-19 | Discharge: 2023-05-22 | DRG: 786 | Disposition: A | Discharge status: Home / Self Care | Payer: MEDICAID | Attending: Family Medicine | Admitting: Family Medicine

## 2023-05-19 ENCOUNTER — Other Ambulatory Visit: Payer: Self-pay

## 2023-05-19 DIAGNOSIS — Z791 Long term (current) use of non-steroidal anti-inflammatories (NSAID): Secondary | ICD-10-CM | POA: Diagnosis not present

## 2023-05-19 DIAGNOSIS — O99334 Smoking (tobacco) complicating childbirth: Secondary | ICD-10-CM | POA: Diagnosis present

## 2023-05-19 DIAGNOSIS — Z8249 Family history of ischemic heart disease and other diseases of the circulatory system: Secondary | ICD-10-CM

## 2023-05-19 DIAGNOSIS — F17210 Nicotine dependence, cigarettes, uncomplicated: Secondary | ICD-10-CM | POA: Diagnosis present

## 2023-05-19 DIAGNOSIS — O09523 Supervision of elderly multigravida, third trimester: Secondary | ICD-10-CM

## 2023-05-19 DIAGNOSIS — O368330 Maternal care for abnormalities of the fetal heart rate or rhythm, third trimester, not applicable or unspecified: Secondary | ICD-10-CM

## 2023-05-19 DIAGNOSIS — D573 Sickle-cell trait: Secondary | ICD-10-CM | POA: Diagnosis present

## 2023-05-19 DIAGNOSIS — Z148 Genetic carrier of other disease: Secondary | ICD-10-CM | POA: Diagnosis not present

## 2023-05-19 DIAGNOSIS — O4593 Premature separation of placenta, unspecified, third trimester: Secondary | ICD-10-CM | POA: Diagnosis present

## 2023-05-19 DIAGNOSIS — Z3A30 30 weeks gestation of pregnancy: Secondary | ICD-10-CM

## 2023-05-19 DIAGNOSIS — Z833 Family history of diabetes mellitus: Secondary | ICD-10-CM | POA: Diagnosis not present

## 2023-05-19 DIAGNOSIS — Z7982 Long term (current) use of aspirin: Secondary | ICD-10-CM | POA: Diagnosis not present

## 2023-05-19 DIAGNOSIS — D62 Acute posthemorrhagic anemia: Secondary | ICD-10-CM | POA: Diagnosis not present

## 2023-05-19 DIAGNOSIS — O9081 Anemia of the puerperium: Secondary | ICD-10-CM | POA: Diagnosis not present

## 2023-05-19 DIAGNOSIS — Z363 Encounter for antenatal screening for malformations: Secondary | ICD-10-CM

## 2023-05-19 DIAGNOSIS — O42913 Preterm premature rupture of membranes, unspecified as to length of time between rupture and onset of labor, third trimester: Secondary | ICD-10-CM

## 2023-05-19 DIAGNOSIS — O42919 Preterm premature rupture of membranes, unspecified as to length of time between rupture and onset of labor, unspecified trimester: Principal | ICD-10-CM | POA: Diagnosis present

## 2023-05-19 LAB — CBC
HCT: 27.2 % — ABNORMAL LOW (ref 36.0–46.0)
HCT: 27.1 % — ABNORMAL LOW (ref 36.0–46.0)
Hemoglobin: 10.1 g/dL — ABNORMAL LOW (ref 12.0–15.0)
Hemoglobin: 9.8 g/dL — ABNORMAL LOW (ref 12.0–15.0)
MCH: 24.6 pg — ABNORMAL LOW (ref 26.0–34.0)
MCH: 24.3 pg — ABNORMAL LOW (ref 26.0–34.0)
MCHC: 37.1 g/dL — ABNORMAL HIGH (ref 30.0–36.0)
MCHC: 36.2 g/dL — ABNORMAL HIGH (ref 30.0–36.0)
MCV: 66.3 fL — ABNORMAL LOW (ref 80.0–100.0)
MCV: 67.1 fL — ABNORMAL LOW (ref 80.0–100.0)
Platelets: 99 10*3/uL — ABNORMAL LOW (ref 150–400)
Platelets: 104 10*3/uL — ABNORMAL LOW (ref 150–400)
RBC: 4.1 MIL/uL (ref 3.87–5.11)
RBC: 4.04 MIL/uL (ref 3.87–5.11)
RDW: 17.7 % — ABNORMAL HIGH (ref 11.5–15.5)
RDW: 17.7 % — ABNORMAL HIGH (ref 11.5–15.5)
WBC: 12.2 10*3/uL — ABNORMAL HIGH (ref 4.0–10.5)
WBC: 14.4 10*3/uL — ABNORMAL HIGH (ref 4.0–10.5)
nRBC: 0 % (ref 0.0–0.2)
nRBC: 0 % (ref 0.0–0.2)

## 2023-05-19 LAB — WET PREP, GENITAL
Clue Cells Wet Prep HPF POC: NONE SEEN
Sperm: NONE SEEN
Trich, Wet Prep: NONE SEEN
WBC, Wet Prep HPF POC: >=10 — AB (ref <10)
Yeast Wet Prep HPF POC: NONE SEEN

## 2023-05-19 LAB — POCT FERN TEST: POCT Fern Test: POSITIVE

## 2023-05-19 MED ORDER — ONDANSETRON HCL 4 MG/2ML IJ SOLN: 4.0000 mg | Freq: Four times a day (QID) | INTRAMUSCULAR | Status: DC | PRN

## 2023-05-19 MED ORDER — LACTATED RINGERS IV BOLUS
1000.0000 mL | Freq: Once | INTRAVENOUS | Status: AC
  Administered 2023-05-19: 1000 mL via INTRAVENOUS

## 2023-05-19 MED ORDER — BETAMETHASONE SOD PHOS & ACET 6 (3-3) MG/ML IJ SUSP
12.0000 mg | Freq: Once | INTRAMUSCULAR | Status: AC
  Administered 2023-05-19: 12 mg via INTRAMUSCULAR
  Filled 2023-05-19: qty 5

## 2023-05-19 MED ORDER — DOCUSATE SODIUM 100 MG PO CAPS
100.0000 mg | ORAL_CAPSULE | Freq: Every day | ORAL | Status: DC
  Administered 2023-05-20: 100 mg via ORAL
  Filled 2023-05-19 (×2): qty 1

## 2023-05-19 MED ORDER — LACTATED RINGERS IV SOLN: INTRAVENOUS | Status: DC

## 2023-05-19 MED ORDER — SODIUM CHLORIDE 0.9 % IV SOLN
2.0000 g | Freq: Four times a day (QID) | INTRAVENOUS | Status: DC
  Administered 2023-05-19: 2 g via INTRAVENOUS
  Filled 2023-05-19: qty 2000

## 2023-05-19 MED ORDER — MAGNESIUM SULFATE BOLUS VIA INFUSION
4.0000 g | Freq: Once | INTRAVENOUS | Status: AC
  Administered 2023-05-19: 4 g via INTRAVENOUS
  Filled 2023-05-19: qty 1000

## 2023-05-19 MED ORDER — MAGNESIUM SULFATE 40 GM/1000ML IV SOLN
1.0000 g/h | INTRAVENOUS | Status: DC
  Administered 2023-05-19: 1 g/h via INTRAVENOUS
  Filled 2023-05-19: qty 1000

## 2023-05-19 MED ORDER — CALCIUM CARBONATE ANTACID 500 MG PO CHEW: 2.0000 | CHEWABLE_TABLET | ORAL | Status: DC | PRN

## 2023-05-19 MED ORDER — BETAMETHASONE SOD PHOS & ACET 6 (3-3) MG/ML IJ SUSP: 12.0000 mg | Freq: Once | INTRAMUSCULAR | Status: DC

## 2023-05-19 MED ORDER — ACETAMINOPHEN 325 MG PO TABS: 650.0000 mg | ORAL_TABLET | ORAL | Status: DC | PRN

## 2023-05-19 MED ORDER — AZITHROMYCIN 250 MG PO TABS
1000.0000 mg | ORAL_TABLET | Freq: Once | ORAL | Status: AC
  Administered 2023-05-19: 1000 mg via ORAL
  Filled 2023-05-19: qty 4

## 2023-05-19 MED ORDER — AMOXICILLIN 500 MG PO CAPS: 500.0000 mg | ORAL_CAPSULE | Freq: Three times a day (TID) | ORAL | Status: DC

## 2023-05-19 MED ORDER — PRENATAL MULTIVITAMIN CH: 1.0000 | ORAL_TABLET | Freq: Every day | ORAL | Status: DC

## 2023-05-19 NOTE — H&P (Signed)
 Obstetric History and Physical  Erin Archer is a 38 y.o. [phone] with IUP at [redacted]w[redacted]d presenting for PPROM.   Receives prenatal care at [facility]. Denies complications with pregnancy.  Reports loss of fluid x 2 this morning at 830 am. Hasn't noticed leaking since then. Denies abdominal pain or vaginal bleeding. Reports slight decrease in fetal movement since this morning. Last intercourse Thursday night.     Prenatal Course Source of Care: [facility]   Pregnancy complications or risks: - Anemia - +THC on UDS at prenatal intake   Prenatal labs and studies: ABO, Rh: A pos Antibody: ABS neg Rubella:  immune RPR:   nonreactive HBsAg:   nonreactive HIV:   nonreactive GBS: unknown 1 hr Glucola  133 Genetic screening: low risk NIPT Anatomy US : normal  Past Medical History:  Diagnosis Date   Sickle cell trait ([facility])    Thalassemia    Thrombocytopenia ([facility])    Urinary tract infection     Past Surgical History:  Procedure Laterality Date   INDUCED ABORTION     x2            OB History  Gravida Para Term Preterm AB Living  6 3 3  2 3   SAB IAB Ectopic Multiple Live Births   2   3    # Outcome Date GA Lbr Len/2nd Weight Sex Type Anes PTL Lv  6 Current           5 Term 02/08/11 [redacted]w[redacted]d 03:17 / 00:03 3310 g F Vag-Spont None  LIV     Birth Comments: No anomalies noted  4 Term 07/15/08    M Vag-Spont  N LIV  3 Term 11/04/03    M Vag-Spont  N LIV  2 IAB           1 IAB             Social History   Socioeconomic History   Marital status: Single    Spouse name: Not on file   Number of children: Not on file   Years of education: Not on file   Highest education level: Not on file  Occupational History   Not on file  Tobacco Use   Smoking status: Some Days    Current packs/day: 0.25    Types: Cigarettes   Smokeless tobacco: Never   Tobacco comments:    with preg  Vaping Use   Vaping status: Never Used  Substance and Sexual Activity   Alcohol use: No    Alcohol/week: 1.0  standard drink of alcohol    Types: 1 Shots of liquor per week   Drug use: Yes    Types: Marijuana    Comment: daily   Sexual activity: Yes  Other Topics Concern   Not on file  Social History Narrative   Not on file   Social Drivers of Health   Financial Resource Strain: Not on file  Food Insecurity: Not on file  Transportation Needs: Not on file  Physical Activity: Not on file  Stress: Not on file  Social Connections: Not on file    Family History  Problem Relation Age of Onset   Graves' disease Mother    Stroke Father    Heart failure Father    Diabetes Father    Anesthesia problems Neg Hx     Medications Prior to Admission  Medication Sig Dispense Refill Last Dose/Taking   aspirin 81 MG chewable tablet Chew 81 mg by mouth daily.   05/18/2023  ferrous sulfate 325 (65 FE) MG EC tablet Take 325 mg by mouth every other day.   05/18/2023   Prenatal Vit-Fe Fumarate-FA (PRENATAL MULTIVITAMIN) TABS tablet Take 1 tablet by mouth daily at 12 noon.   05/18/2023   acetaminophen  (TYLENOL ) 500 MG tablet Take 500 mg by mouth every 6 (six) hours as needed for mild pain.      amoxicillin  (AMOXIL ) 500 MG capsule Take 1 capsule (500 mg total) by mouth 3 (three) times daily. 21 capsule 0    clotrimazole  (LOTRIMIN ) 1 % cream Apply to affected area 2 times daily 15 g 0    ibuprofen  (ADVIL ,MOTRIN ) 200 MG tablet Take 200 mg by mouth every 6 (six) hours as needed for moderate pain.      ibuprofen  (ADVIL ,MOTRIN ) 800 MG tablet Take 1 tablet (800 mg total) by mouth 3 (three) times daily. 21 tablet 0    meloxicam  (MOBIC ) 15 MG tablet Take 1 tablet (15 mg total) by mouth daily. 30 tablet 0    phenazopyridine  (PYRIDIUM ) 200 MG tablet Take 1 tablet (200 mg total) by mouth 3 (three) times daily as needed for pain. 6 tablet 0    predniSONE  (STERAPRED UNI-PAK 21 TAB) 10 MG (21) TBPK tablet Take by mouth daily. Follow package insert 21 tablet 0     No Known Allergies  Review of Systems: Negative except  for what is mentioned in HPI.  Physical Exam: BP 118/70 (BP Location: Right Arm)   Pulse 93   Temp 97.9 F (36.6 C) (Oral)   Resp 20   Ht 5\' 5"  (1.651 m)   Wt 69.8 kg   SpO2 100%   BMI 25.59 kg/m  CONSTITUTIONAL: Well-developed, well-nourished female in no acute distress.  HENT:  Normocephalic, atraumatic, External right and left ear normal. Oropharynx is clear and moist EYES: Conjunctivae and EOM are normal. Pupils are equal, round, and reactive to light. No scleral icterus.  NECK: Normal range of motion, supple, no masses SKIN: Skin is warm and dry. No rash noted. Not diaphoretic. No erythema. No pallor. NEUROLOGIC: Alert and oriented to person, place, and time. Normal reflexes, muscle tone coordination. No cranial nerve deficit noted. PSYCHIATRIC: Normal mood and affect. Normal behavior. Normal judgment and thought content. CARDIOVASCULAR: regular rate RESPIRATORY: Effort and breath sounds normal  ABDOMEN: Soft, nontender, nondistended, gravid. MUSCULOSKELETAL: Normal range of motion. No edema and no tenderness. 2+ distal pulses.  Per NP in MAU: SSE: NEFG. No blood. Pooling clear fluid. Cervix visually closed.   FHT:  Baseline rate 150 bpm   Variability moderate  Accelerations present   Decelerations irregular variables Contractions: irritability   Pertinent Labs/Studies:   Results for orders placed or performed during the hospital encounter of 05/19/23 (from the past 24 hours)  POCT fern test     Status: Abnormal   Collection Time: 05/19/23  7:40 PM  Result Value Ref Range   POCT Fern Test Positive = ruptured amniotic membanes   Wet prep, genital     Status: Abnormal   Collection Time: 05/19/23  7:47 PM   Specimen: Vaginal  Result Value Ref Range   Yeast Wet Prep HPF POC NONE SEEN NONE SEEN   Trich, Wet Prep NONE SEEN NONE SEEN   Clue Cells Wet Prep HPF POC NONE SEEN NONE SEEN   WBC, Wet Prep HPF POC >=10 (A) <10   Sperm NONE SEEN   Type and screen     Status: None  (Preliminary result)   Collection Time: 05/19/23  9:00 PM  Result Value Ref Range   ABO/RH(D) PENDING    Antibody Screen PENDING    Sample Expiration      05/22/2023,2359 Performed at [facility], [address], [place], [place] [address]     Assessment : Erin Archer is a 38 y.o. [id number] at [redacted]w[redacted]d being admitted for PPROM  Plan: Latency antibiotics BMZ  Mag Ultrasound ordered Admission labs GBS pending  Marci Setter, MD, FACOG Obstetrician & Gynecologist, [facility] for [facility], [facility] n

## 2023-05-19 NOTE — MAU Note (Signed)
 Erin Archer is a 38 y.o. at [redacted]w[redacted]d here in MAU reporting: she had leaking of fluid twice this morning @ 0845/0900.  Reports the fluid was clear.  Denies VB.  States last intercourse was Thursday night.  Endorses +FM, not as much as usual.   LMP: NA Onset of complaint: today Pain score: 4 Vitals:   05/19/23 1903  BP: 118/70  Pulse: 93  Resp: 20  Temp: 97.9 F (36.6 C)  SpO2: 100%     FHT: 165 bpm  Lab orders placed from triage: UA

## 2023-05-19 NOTE — MAU Provider Note (Signed)
 History     CSN: [id number]  Arrival date and time: 05/19/23 1842   None     Chief Complaint  Patient presents with   Rupture of Membranes   HPI Erin Archer is a 38 y.o. [id number] at [redacted]w[redacted]d who presents for LOF. Receives prenatal care at [facility]. Denies complications with pregnancy.  Reports loss of fluid x 2 this morning at 830 am. Hasn't noticed leaking since then. Denies abdominal pain or vaginal bleeding. Reports slight decrease in fetal movement since this morning. Last intercourse Thursday night.   OB History     Gravida  6   Para  3   Term  3   Preterm      AB  2   Living  3      SAB      IAB  2   Ectopic      Multiple      Live Births  3           Past Medical History:  Diagnosis Date   Sickle cell trait (HCC)    Thalassemia    Thrombocytopenia (HCC)    Urinary tract infection     Past Surgical History:  Procedure Laterality Date   INDUCED ABORTION     x2   TUBAL LIGATION  02/09/2011   Procedure: POST PARTUM TUBAL LIGATION;  Surgeon: Anselmo Kings, MD;  Location: [facility];  Service: Gynecology;  Laterality: Bilateral;    Family History  Problem Relation Age of Onset   Graves' disease Mother    Stroke Father    Heart failure Father    Diabetes Father    Anesthesia problems Neg Hx     Social History   Tobacco Use   Smoking status: Some Days    Current packs/day: 0.25    Types: Cigarettes   Smokeless tobacco: Never   Tobacco comments:    with preg  Vaping Use   Vaping status: Never Used  Substance Use Topics   Alcohol use: No    Alcohol/week: 1.0 standard drink of alcohol    Types: 1 Shots of liquor per week   Drug use: Yes    Types: Marijuana    Comment: daily    Allergies: No Known Allergies  Medications Prior to Admission  Medication Sig Dispense Refill Last Dose/Taking   aspirin 81 MG chewable tablet Chew 81 mg by mouth daily.   05/18/2023   ferrous sulfate 325 (65 FE) MG EC tablet Take 325 mg by mouth every  other day.   05/18/2023   Prenatal Vit-Fe Fumarate-FA (PRENATAL MULTIVITAMIN) TABS tablet Take 1 tablet by mouth daily at 12 noon.   05/18/2023   acetaminophen  (TYLENOL ) 500 MG tablet Take 500 mg by mouth every 6 (six) hours as needed for mild pain.      amoxicillin  (AMOXIL ) 500 MG capsule Take 1 capsule (500 mg total) by mouth 3 (three) times daily. 21 capsule 0    clotrimazole  (LOTRIMIN ) 1 % cream Apply to affected area 2 times daily 15 g 0    ibuprofen  (ADVIL ,MOTRIN ) 200 MG tablet Take 200 mg by mouth every 6 (six) hours as needed for moderate pain.      ibuprofen  (ADVIL ,MOTRIN ) 800 MG tablet Take 1 tablet (800 mg total) by mouth 3 (three) times daily. 21 tablet 0    meloxicam  (MOBIC ) 15 MG tablet Take 1 tablet (15 mg total) by mouth daily. 30 tablet 0    phenazopyridine  (PYRIDIUM ) 200 MG tablet Take 1  tablet (200 mg total) by mouth 3 (three) times daily as needed for pain. 6 tablet 0    predniSONE  (STERAPRED UNI-PAK 21 TAB) 10 MG (21) TBPK tablet Take by mouth daily. Follow package insert 21 tablet 0     Review of Systems  All other systems reviewed and are negative.  Physical Exam   Blood pressure 118/70, pulse 93, temperature 97.9 F (36.6 C), temperature source Oral, resp. rate 20, height 5\' 5"  (1.651 m), weight 69.8 kg, SpO2 100%.  Physical Exam Vitals and nursing note reviewed. Exam conducted with a chaperone present.  Constitutional:      General: She is not in acute distress.    Appearance: Normal appearance. She is not ill-appearing.  HENT:     Head: Normocephalic and atraumatic.  Eyes:     General: No scleral icterus.    Pupils: Pupils are equal, round, and reactive to light.  Pulmonary:     Effort: Pulmonary effort is normal. No respiratory distress.  Abdominal:     Tenderness: There is no abdominal tenderness.     Comments: gravid  Genitourinary:    Comments: SSE: NEFG. No blood. Pooling clear fluid. Cervix visually closed.  Skin:    General: Skin is warm and dry.   Neurological:     Mental Status: She is alert.    NST:  Baseline: 150 bpm, Variability: Good {> 6 bpm), Accelerations: none, and Decelerations: recurrent variables  Pt informed that the ultrasound is considered a limited OB ultrasound and is not intended to be a complete ultrasound exam.  Patient also informed that the ultrasound is not being completed with the intent of assessing for fetal or placental anomalies or any pelvic abnormalities.  Explained that the purpose of today's ultrasound is to assess for  presentation.  Patient acknowledges the purpose of the exam and the limitations of the study.   Cephalic  MAU Course  Procedures Results for orders placed or performed during the hospital encounter of 05/19/23 (from the past 24 hours)  POCT fern test     Status: Abnormal   Collection Time: 05/19/23  7:40 PM  Result Value Ref Range   POCT Fern Test Positive = ruptured amniotic membanes   Wet prep, genital     Status: Abnormal   Collection Time: 05/19/23  7:47 PM   Specimen: Vaginal  Result Value Ref Range   Yeast Wet Prep HPF POC NONE SEEN NONE SEEN   Trich, Wet Prep NONE SEEN NONE SEEN   Clue Cells Wet Prep HPF POC NONE SEEN NONE SEEN   WBC, Wet Prep HPF POC >=10 (A) <10   Sperm NONE SEEN     MDM   Assessment and Plan   1. Preterm premature rupture of membranes (PPROM) with unknown onset of labor   2. [redacted] weeks gestation of pregnancy    -PPROM confirmed, + pooling & + fern.  -Vertex per BSUS -Wet prep, GC/CT & GBS culture collected  Dr. Vallarie Gauze notified of exam -Antenatal admission orders placed -BMZ #1 given in MAU -Mag 4gm bolus followed by 1 gm/hr per Dr. Vallarie Gauze for neuro protection -Ultrasound ordered for measurements  Terri Fester 05/19/2023, 8:22 PM

## 2023-05-20 ENCOUNTER — Inpatient Hospital Stay (HOSPITAL_COMMUNITY): Payer: MEDICAID | Admitting: Anesthesiology

## 2023-05-20 ENCOUNTER — Other Ambulatory Visit: Payer: Self-pay

## 2023-05-20 ENCOUNTER — Encounter (HOSPITAL_COMMUNITY)
Admission: AD | Disposition: A | Discharge status: Home / Self Care | Payer: Self-pay | Source: Home / Self Care | Attending: Obstetrics and Gynecology

## 2023-05-20 ENCOUNTER — Encounter (HOSPITAL_COMMUNITY): Payer: Self-pay | Admitting: Obstetrics and Gynecology

## 2023-05-20 DIAGNOSIS — O4593 Premature separation of placenta, unspecified, third trimester: Secondary | ICD-10-CM

## 2023-05-20 DIAGNOSIS — O42013 Preterm premature rupture of membranes, onset of labor within 24 hours of rupture, third trimester: Secondary | ICD-10-CM

## 2023-05-20 DIAGNOSIS — Z3A30 30 weeks gestation of pregnancy: Secondary | ICD-10-CM

## 2023-05-20 DIAGNOSIS — O99324 Drug use complicating childbirth: Secondary | ICD-10-CM

## 2023-05-20 LAB — CBC
HCT: 28 % — ABNORMAL LOW (ref 36.0–46.0)
Hemoglobin: 10.5 g/dL — ABNORMAL LOW (ref 12.0–15.0)
MCH: 24.6 pg — ABNORMAL LOW (ref 26.0–34.0)
MCHC: 37.5 g/dL — ABNORMAL HIGH (ref 30.0–36.0)
MCV: 65.7 fL — ABNORMAL LOW (ref 80.0–100.0)
Platelets: 121 10*3/uL — ABNORMAL LOW (ref 150–400)
RBC: 4.26 MIL/uL (ref 3.87–5.11)
RDW: 18 % — ABNORMAL HIGH (ref 11.5–15.5)
WBC: 16.9 10*3/uL — ABNORMAL HIGH (ref 4.0–10.5)
nRBC: 0 % (ref 0.0–0.2)

## 2023-05-20 LAB — DIC (DISSEMINATED INTRAVASCULAR COAGULATION)PANEL
D-Dimer, Quant: 5.04 ug{FEU}/mL — ABNORMAL HIGH (ref 0.00–0.50)
Fibrinogen: 474 mg/dL (ref 210–475)
INR: 1.2 (ref 0.8–1.2)
Platelets: 116 10*3/uL — ABNORMAL LOW (ref 150–400)
Prothrombin Time: 15 s (ref 11.4–15.2)
aPTT: 29 s (ref 24–36)

## 2023-05-20 LAB — PREPARE RBC (CROSSMATCH)

## 2023-05-20 LAB — RPR: RPR Ser Ql: NONREACTIVE

## 2023-05-20 LAB — FIBRINOGEN: Fibrinogen: 445 mg/dL (ref 210–475)

## 2023-05-20 SURGERY — Surgical Case
Anesthesia: Spinal | Site: Abdomen

## 2023-05-20 MED ORDER — ENOXAPARIN SODIUM 40 MG/0.4ML IJ SOSY
40.0000 mg | PREFILLED_SYRINGE | INTRAMUSCULAR | Status: DC
  Administered 2023-05-20 – 2023-05-21 (×2): 40 mg via SUBCUTANEOUS
  Filled 2023-05-20 (×2): qty 0.4

## 2023-05-20 MED ORDER — SIMETHICONE 80 MG PO CHEW: 80.0000 mg | CHEWABLE_TABLET | ORAL | Status: DC | PRN

## 2023-05-20 MED ORDER — DIPHENHYDRAMINE HCL 50 MG/ML IJ SOLN: 12.5000 mg | INTRAMUSCULAR | Status: DC | PRN

## 2023-05-20 MED ORDER — SOD CITRATE-CITRIC ACID 500-334 MG/5ML PO SOLN
ORAL | Status: AC
  Filled 2023-05-20: qty 30

## 2023-05-20 MED ORDER — MAGNESIUM HYDROXIDE 400 MG/5ML PO SUSP: 30.0000 mL | ORAL | Status: DC | PRN

## 2023-05-20 MED ORDER — SIMETHICONE 80 MG PO CHEW
80.0000 mg | CHEWABLE_TABLET | Freq: Three times a day (TID) | ORAL | Status: DC
  Administered 2023-05-20 – 2023-05-22 (×6): 80 mg via ORAL
  Filled 2023-05-20 (×7): qty 1

## 2023-05-20 MED ORDER — TETANUS-DIPHTH-ACELL PERTUSSIS 5-2.5-18.5 LF-MCG/0.5 IM SUSY: 0.5000 mL | PREFILLED_SYRINGE | Freq: Once | INTRAMUSCULAR | Status: DC

## 2023-05-20 MED ORDER — LACTATED RINGERS IV SOLN
INTRAVENOUS | Status: DC
Start: 2023-05-20 — End: 2023-05-22

## 2023-05-20 MED ORDER — GABAPENTIN 100 MG PO CAPS
200.0000 mg | ORAL_CAPSULE | Freq: Every day | ORAL | Status: DC
  Administered 2023-05-20 – 2023-05-21 (×2): 200 mg via ORAL
  Filled 2023-05-20 (×2): qty 2

## 2023-05-20 MED ORDER — OXYTOCIN-SODIUM CHLORIDE 30-0.9 UT/500ML-% IV SOLN: 2.5000 [IU]/h | INTRAVENOUS | Status: AC

## 2023-05-20 MED ORDER — KETOROLAC TROMETHAMINE 30 MG/ML IJ SOLN: 30.0000 mg | Freq: Once | INTRAMUSCULAR | Status: DC

## 2023-05-20 MED ORDER — DIPHENHYDRAMINE HCL 25 MG PO CAPS: 25.0000 mg | ORAL_CAPSULE | Freq: Four times a day (QID) | ORAL | Status: DC | PRN

## 2023-05-20 MED ORDER — OXYTOCIN-SODIUM CHLORIDE 30-0.9 UT/500ML-% IV SOLN
INTRAVENOUS | Status: DC | PRN
  Administered 2023-05-20: 30 [IU] via INTRAVENOUS

## 2023-05-20 MED ORDER — FENTANYL CITRATE (PF) 100 MCG/2ML IJ SOLN
INTRAMUSCULAR | Status: AC
  Filled 2023-05-20: qty 2

## 2023-05-20 MED ORDER — ACETAMINOPHEN 10 MG/ML IV SOLN
INTRAVENOUS | Status: DC | PRN
  Administered 2023-05-20: 1000 mg via INTRAVENOUS

## 2023-05-20 MED ORDER — SODIUM CHLORIDE 0.9 % IV SOLN
INTRAVENOUS | Status: DC | PRN
Start: 2023-05-20 — End: 2023-05-20

## 2023-05-20 MED ORDER — METHYLERGONOVINE MALEATE 0.2 MG/ML IJ SOLN
INTRAMUSCULAR | Status: AC
  Filled 2023-05-20: qty 1

## 2023-05-20 MED ORDER — ONDANSETRON HCL 4 MG/2ML IJ SOLN
INTRAMUSCULAR | Status: AC
  Filled 2023-05-20: qty 2

## 2023-05-20 MED ORDER — ZOLPIDEM TARTRATE 5 MG PO TABS: 5.0000 mg | ORAL_TABLET | Freq: Every evening | ORAL | Status: DC | PRN

## 2023-05-20 MED ORDER — WITCH HAZEL-GLYCERIN EX PADS: 1.0000 | MEDICATED_PAD | CUTANEOUS | Status: DC | PRN

## 2023-05-20 MED ORDER — FENTANYL CITRATE (PF) 100 MCG/2ML IJ SOLN: 25.0000 ug | INTRAMUSCULAR | Status: DC | PRN

## 2023-05-20 MED ORDER — METHYLERGONOVINE MALEATE 0.2 MG/ML IJ SOLN
0.2000 mg | Freq: Once | INTRAMUSCULAR | Status: AC
  Administered 2023-05-20: 0.2 mg via INTRAMUSCULAR

## 2023-05-20 MED ORDER — KETOROLAC TROMETHAMINE 30 MG/ML IJ SOLN: 30.0000 mg | Freq: Four times a day (QID) | INTRAMUSCULAR | Status: AC | PRN

## 2023-05-20 MED ORDER — PRENATAL MULTIVITAMIN CH
1.0000 | ORAL_TABLET | Freq: Every day | ORAL | Status: DC
  Administered 2023-05-20 – 2023-05-21 (×2): 1 via ORAL
  Filled 2023-05-20 (×2): qty 1

## 2023-05-20 MED ORDER — CEFAZOLIN SODIUM-DEXTROSE 2-3 GM-%(50ML) IV SOLR
INTRAVENOUS | Status: DC | PRN
  Administered 2023-05-20: 2 g via INTRAVENOUS

## 2023-05-20 MED ORDER — HYDROXYZINE HCL 50 MG PO TABS
25.0000 mg | ORAL_TABLET | Freq: Three times a day (TID) | ORAL | Status: DC | PRN
Start: 2023-05-20 — End: 2023-05-22
  Administered 2023-05-20: 25 mg via ORAL
  Filled 2023-05-20: qty 1

## 2023-05-20 MED ORDER — BUPIVACAINE IN DEXTROSE 0.75-8.25 % IT SOLN
INTRATHECAL | Status: DC | PRN
  Administered 2023-05-20: 1.6 mL via INTRATHECAL

## 2023-05-20 MED ORDER — SODIUM CHLORIDE 0.9 % IR SOLN
Status: DC | PRN
  Administered 2023-05-20: 1

## 2023-05-20 MED ORDER — IBUPROFEN 600 MG PO TABS
600.0000 mg | ORAL_TABLET | Freq: Four times a day (QID) | ORAL | Status: DC
  Administered 2023-05-20 – 2023-05-22 (×6): 600 mg via ORAL
  Filled 2023-05-20 (×6): qty 1

## 2023-05-20 MED ORDER — PHENYLEPHRINE HCL-NACL 20-0.9 MG/250ML-% IV SOLN
INTRAVENOUS | Status: DC | PRN
  Administered 2023-05-20: 60 ug/min via INTRAVENOUS

## 2023-05-20 MED ORDER — ACETAMINOPHEN 500 MG PO TABS
1000.0000 mg | ORAL_TABLET | Freq: Four times a day (QID) | ORAL | Status: DC
  Administered 2023-05-20 – 2023-05-22 (×7): 1000 mg via ORAL
  Filled 2023-05-20 (×7): qty 2

## 2023-05-20 MED ORDER — DEXAMETHASONE SODIUM PHOSPHATE 10 MG/ML IJ SOLN
INTRAMUSCULAR | Status: DC | PRN
  Administered 2023-05-20: 10 mg via INTRAVENOUS

## 2023-05-20 MED ORDER — MORPHINE SULFATE (PF) 0.5 MG/ML IJ SOLN
INTRAMUSCULAR | Status: DC | PRN
Start: 2023-05-20 — End: 2023-05-20
  Administered 2023-05-20: 150 ug via INTRATHECAL

## 2023-05-20 MED ORDER — DROPERIDOL 2.5 MG/ML IJ SOLN: 0.6250 mg | Freq: Once | INTRAMUSCULAR | Status: DC | PRN

## 2023-05-20 MED ORDER — SENNOSIDES-DOCUSATE SODIUM 8.6-50 MG PO TABS
2.0000 | ORAL_TABLET | Freq: Every day | ORAL | Status: DC
  Filled 2023-05-20: qty 2

## 2023-05-20 MED ORDER — LACTATED RINGERS IV SOLN
INTRAVENOUS | Status: DC | PRN
Start: 2023-05-20 — End: 2023-05-20

## 2023-05-20 MED ORDER — DEXAMETHASONE SODIUM PHOSPHATE 4 MG/ML IJ SOLN
INTRAMUSCULAR | Status: AC
  Filled 2023-05-20: qty 1

## 2023-05-20 MED ORDER — MORPHINE SULFATE (PF) 0.5 MG/ML IJ SOLN
INTRAMUSCULAR | Status: AC
Start: 2023-05-20 — End: ?
  Filled 2023-05-20: qty 10

## 2023-05-20 MED ORDER — MENTHOL 3 MG MT LOZG: 1.0000 | LOZENGE | OROMUCOSAL | Status: DC | PRN

## 2023-05-20 MED ORDER — MEDROXYPROGESTERONE ACETATE 150 MG/ML IM SUSP: 150.0000 mg | INTRAMUSCULAR | Status: DC | PRN

## 2023-05-20 MED ORDER — FENTANYL CITRATE (PF) 100 MCG/2ML IJ SOLN
INTRAMUSCULAR | Status: DC | PRN
  Administered 2023-05-20: 15 ug via INTRATHECAL

## 2023-05-20 MED ORDER — DIBUCAINE (PERIANAL) 1 % EX OINT: 1.0000 | TOPICAL_OINTMENT | CUTANEOUS | Status: DC | PRN

## 2023-05-20 MED ORDER — MEASLES, MUMPS & RUBELLA VAC IJ SOLR: 0.5000 mL | Freq: Once | INTRAMUSCULAR | Status: DC

## 2023-05-20 MED ORDER — SODIUM CHLORIDE 0.9 % IV SOLN
INTRAVENOUS | Status: DC | PRN
  Administered 2023-05-20: 500 mg via INTRAVENOUS

## 2023-05-20 MED ORDER — DIPHENHYDRAMINE HCL 25 MG PO CAPS
25.0000 mg | ORAL_CAPSULE | ORAL | Status: DC | PRN
  Administered 2023-05-20: 25 mg via ORAL
  Filled 2023-05-20 (×2): qty 1

## 2023-05-20 MED ORDER — ONDANSETRON HCL 4 MG/2ML IJ SOLN
INTRAMUSCULAR | Status: DC | PRN
  Administered 2023-05-20: 4 mg via INTRAVENOUS

## 2023-05-20 MED ORDER — STERILE WATER FOR IRRIGATION IR SOLN
Status: DC | PRN
  Administered 2023-05-20: 1

## 2023-05-20 MED ORDER — OXYCODONE HCL 5 MG PO TABS
5.0000 mg | ORAL_TABLET | ORAL | Status: DC | PRN
  Administered 2023-05-21: 5 mg via ORAL
  Filled 2023-05-20: qty 1
  Filled 2023-05-20: qty 2

## 2023-05-20 MED ORDER — TRANEXAMIC ACID-NACL 1000-0.7 MG/100ML-% IV SOLN
INTRAVENOUS | Status: DC | PRN
Start: 2023-05-20 — End: 2023-05-20
  Administered 2023-05-20: 1000 mg via INTRAVENOUS

## 2023-05-20 MED ORDER — KETOROLAC TROMETHAMINE 30 MG/ML IJ SOLN
30.0000 mg | Freq: Four times a day (QID) | INTRAMUSCULAR | Status: AC
  Administered 2023-05-20 – 2023-05-21 (×3): 30 mg via INTRAVENOUS
  Filled 2023-05-20 (×3): qty 1

## 2023-05-20 MED ORDER — COCONUT OIL OIL
1.0000 | TOPICAL_OIL | Status: DC | PRN
  Administered 2023-05-20: 1 via TOPICAL

## 2023-05-20 SURGICAL SUPPLY — 29 items
BENZOIN TINCTURE PRP APPL 2/3 (GAUZE/BANDAGES/DRESSINGS) IMPLANT
CHLORAPREP W/TINT 26 (MISCELLANEOUS) ×2 IMPLANT
CLAMP UMBILICAL CORD (MISCELLANEOUS) ×1 IMPLANT
CLOTH BEACON ORANGE TIMEOUT ST (SAFETY) ×1 IMPLANT
DRSG OPSITE POSTOP 4X10 (GAUZE/BANDAGES/DRESSINGS) ×1 IMPLANT
ELECTRODE REM PT RTRN 9FT ADLT (ELECTROSURGICAL) ×1 IMPLANT
EXTRACTOR VACUUM KIWI (MISCELLANEOUS) IMPLANT
GLOVE BIOGEL PI IND STRL 6.5 (GLOVE) ×1 IMPLANT
GLOVE ECLIPSE 6.5 STRL STRAW (GLOVE) ×1 IMPLANT
GOWN STRL REUS W/TWL LRG LVL3 (GOWN DISPOSABLE) ×2 IMPLANT
KIT ABG SYR 3ML LUER SLIP (SYRINGE) IMPLANT
LIGASURE IMPACT 36 18CM CVD LR (INSTRUMENTS) ×1 IMPLANT
NDL HYPO 25X5/8 SAFETYGLIDE (NEEDLE) IMPLANT
NEEDLE HYPO 22GX1.5 SAFETY (NEEDLE) IMPLANT
NEEDLE HYPO 25X5/8 SAFETYGLIDE (NEEDLE) ×2 IMPLANT
NS IRRIG 1000ML POUR BTL (IV SOLUTION) ×1 IMPLANT
PACK C SECTION WH (CUSTOM PROCEDURE TRAY) ×1 IMPLANT
PAD OB MATERNITY 4.3X12.25 (PERSONAL CARE ITEMS) ×1 IMPLANT
RETRACTOR WND ALEXIS 25 LRG (MISCELLANEOUS) IMPLANT
RETRACTOR WOUND ALXS 25CM LRG (MISCELLANEOUS) IMPLANT
STRIP CLOSURE SKIN 1/2X4 (GAUZE/BANDAGES/DRESSINGS) ×1 IMPLANT
SUT MON AB 4-0 PS1 27 (SUTURE) ×1 IMPLANT
SUT VIC AB 0 CT1 36 (SUTURE) ×1 IMPLANT
SUT VIC AB 0 CTX36XBRD ANBCTRL (SUTURE) ×2 IMPLANT
SUT VIC AB 2-0 CT1 TAPERPNT 27 (SUTURE) IMPLANT
SYR CONTROL 10ML LL (SYRINGE) IMPLANT
TOWEL OR 17X24 6PK STRL BLUE (TOWEL DISPOSABLE) ×2 IMPLANT
TRAY FOLEY W/BAG SLVR 14FR LF (SET/KITS/TRAYS/PACK) IMPLANT
WATER STERILE IRR 1000ML POUR (IV SOLUTION) ×1 IMPLANT
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
## 2023-05-20 NOTE — Anesthesia Postprocedure Evaluation (Signed)
 Anesthesia Post Note  Patient: Erin Archer  Procedure(s) Performed: CESAREAN DELIVERY (Abdomen)     Patient location during evaluation: PACU Anesthesia Type: Spinal Level of consciousness: awake and alert Pain management: pain level controlled Vital Signs Assessment: post-procedure vital signs reviewed and stable Respiratory status: spontaneous breathing, nonlabored ventilation and respiratory function stable Cardiovascular status: blood pressure returned to baseline Postop Assessment: no apparent nausea or vomiting, spinal receding, no headache and no backache Anesthetic complications: no   No notable events documented.  Last Vitals:  Vitals:   05/20/23 0547 05/20/23 0659  BP: 113/63 114/71  Pulse: 79 86  Resp: 16 18  Temp: 36.6 C 36.4 C  SpO2: 99% 99%    Last Pain:  Vitals:   05/20/23 0659  TempSrc: Oral  PainSc:    Pain Goal:                Epidural/Spinal Function Cutaneous sensation: Tingles (05/20/23 0659), Patient able to flex knees: Yes (05/20/23 0659), Patient able to lift hips off bed: No (05/20/23 0659), Back pain beyond tenderness at insertion site: No (05/20/23 0659), Progressively worsening motor and/or sensory loss: No (05/20/23 0659), Bowel and/or bladder incontinence post epidural: No (05/20/23 0659)  Rayfield Cairo

## 2023-05-20 NOTE — Anesthesia Preprocedure Evaluation (Signed)
 Anesthesia Evaluation  Patient identified by MRN, date of birth, ID band Patient awake    Reviewed: Allergy & Precautions, NPO status , Patient's Chart, lab work & pertinent test results  History of Anesthesia Complications Negative for: history of anesthetic complications  Airway Mallampati: II  TM Distance: >3 FB Neck ROM: Full    Dental no notable dental hx.    Pulmonary Current Smoker   Pulmonary exam normal        Cardiovascular negative cardio ROS Normal cardiovascular exam     Neuro/Psych    Depression       GI/Hepatic negative GI ROS, Neg liver ROS,,,  Endo/Other  negative endocrine ROS    Renal/GU negative Renal ROS     Musculoskeletal negative musculoskeletal ROS (+)    Abdominal   Peds  Hematology  (+) Blood dyscrasia (Hgb 9.8, Plt 104k), Sickle cell trait and anemia   Anesthesia Other Findings Day of surgery medications reviewed with patient.  Reproductive/Obstetrics (+) Pregnancy                              Anesthesia Physical Anesthesia Plan  ASA: 2 and emergent  Anesthesia Plan: Spinal   Post-op Pain Management: Ofirmev  IV (intra-op)*   Induction:   PONV Risk Score and Plan: 4 or greater and Treatment may vary due to age or medical condition, Ondansetron  and Dexamethasone  Airway Management Planned: Natural Airway  Additional Equipment: None  Intra-op Plan:   Post-operative Plan:   Informed Consent: I have reviewed the patients History and Physical, chart, labs and discussed the procedure including the risks, benefits and alternatives for the proposed anesthesia with the patient or authorized representative who has indicated his/her understanding and acceptance.       Plan Discussed with: CRNA  Anesthesia Plan Comments: (Urgent C/S for vaginal bleeding, concern for abruption. Plan for spinal anesthetic. Jarrell Merritts, MD)         Anesthesia Quick  Evaluation

## 2023-05-20 NOTE — Op Note (Signed)
 Erin Archer PROCEDURE DATE: 05/20/2023  PREOPERATIVE DIAGNOSES: Intrauterine pregnancy at [redacted]w[redacted]d weeks gestation; abruptio placenta  POSTOPERATIVE DIAGNOSES: The same, viable infant delivered  PROCEDURE: PrimaryLow Transverse Cesarean Section  SURGEON:  Dr. Alto Atta  ASSISTANT:  Ebony Goldstein, MD An experienced assistant was required given the standard of surgical care given the complexity of the case.  This assistant was needed for exposure, dissection, suctioning, retraction, instrument exchange, assisting with delivery with administration of fundal pressure, and for overall help during the procedure.  ANESTHESIOLOGY TEAM: Anesthesiologist: Vernadine Golas, MD CRNA: Cedric Cohn, CRNA  INDICATIONS: Erin Archer is a 38 y.o. [phone] at [redacted]w[redacted]d here for cesarean section secondary to the indications listed under preoperative diagnoses; please see preoperative note for further details.  The risks of surgery were discussed with the patient including but were not limited to: bleeding which may require transfusion or reoperation; infection which may require antibiotics; injury to bowel, bladder, ureters or other surrounding organs; injury to the fetus; need for additional procedures including hysterectomy in the event of a life-threatening hemorrhage; formation of adhesions; placental abnormalities wth subsequent pregnancies; incisional problems; thromboembolic phenomenon and other postoperative/anesthesia complications.  The patient concurred with the proposed plan, giving informed written consent for the procedure.    FINDINGS:  Viable female infant in cephalic presentation.  Apgars 8 and 9.  Amniotic fluid: clear.  Partially separated placenta as delivering through hysterotomy at time of extending, three vessel cord.  Normal uterus, fallopian tubes and ovaries bilaterally.  ANESTHESIA: spinal INTRAVENOUS FLUIDS: 1200 ml   ESTIMATED BLOOD LOSS: 344 ml URINE OUTPUT:  400 ml, clear  yellow SPECIMENS: Placenta sent to pathology and venous cord gas collected COMPLICATIONS: None immediate  PROCEDURE IN DETAIL:  The patient preoperatively received intravenous antibiotics and had sequential compression devices applied to her lower extremities.  She was then taken to the operating room where spinal anesthesia was found to be adequate. She was then placed in a dorsal supine position with a leftward tilt, and prepped and draped in a sterile manner.  A foley catheter was  placed into her bladder and attached to constant gravity.  After an adequate timeout was performed, a Pfannenstiel skin incision was made with scalpel and carried through to the underlying layer of fascia. The fascia was incised in the midline, and this incision was extended bluntly. The rectus muscles were separated in the midline and the peritoneum was entered bluntly.   The Alexis self-retaining retractor was introduced into the abdominal cavity.  Attention was turned to the lower uterine segment where a low transverse hysterotomy was made with a scalpel and extended bluntly in caudad and cephalad directions.  Immediately a partially separated placenta was delivering through hysterotomy.  Placenta slide behind the baby allowing for amniotomy. The infant was successfully delivered with loose body nuchal cord easily reduced, the cord was clamped and cut <30sec given separated placenta, and the infant was handed over to the awaiting neonatology team. Uterine massage was then administered, and the placenta delivered intact with a three-vessel cord. The uterus was then exteriorized and cleared of clots and debris.  The hysterotomy was closed with 0-Vicryl in a running fashion.  A second imbricating layer was placed with 0- Vicryl in running fashion.  The pelvis was cleared of all clot and debris. Hemostasis was confirmed on all surfaces.  The uterus was returned to the abdomen and the incision was once again inspected and found  to be hemostatic. The retractor was removed.  The peritoneum was closed with a 2-0 Vicryl running stitch. The fascia was then closed using 0 Vicryl in a running fashion.  The subcutaneous layer was irrigated, any areas of bleeding were cauterized with the bovie,  was found to be hemostatic.. . The skin was closed with a 4-0 Monocryl subcuticular stitch. The patient tolerated the procedure well. Sponge, instrument and needle counts were correct x 3.  She was taken to the recovery room in stable condition.   Ebony Goldstein, MD FMOB Fellow, Faculty practice [facility], Center for [facility] 05/20/23  3:45 AM

## 2023-05-20 NOTE — Discharge Summary (Addendum)
 Postpartum Discharge Summary  Date of Service updated 05/22/2023      Patient Name: Erin Archer DOB: 1985-09-17 MRN: [id number]  Date of admission: 05/19/2023 Delivery date:05/20/2023 Delivering provider: DUNN, KATHLEEN T Date of discharge: 05/22/2023   Admitting diagnosis: Preterm premature rupture of membranes (PPROM) with unknown onset of labor [O42.919] Intrauterine pregnancy: [redacted]w[redacted]d     Secondary diagnosis:  Principal Problem:   Preterm premature rupture of membranes (PPROM) with unknown onset of labor  Additional problems: anemia    Discharge diagnosis: Preterm Pregnancy Delivered and Placenta Abruption                                               Post partum procedures:none Augmentation: n/a Complications: Placental Abruption  Hospital course: Pt originally admitted after PPROM with plans to give neuro-protective magnesium , get her to steroid complete, as well as latency antibiotics.  However, shortly after admission pt developed significant vaginal bleeding concerning for placental abruption.  Pt taken back for urgent C/S for ?placenta abruption.  In the OR, confirmed with separated placenta found intraoperative.  Placenta sent to pathology for confirmation.  Pt had a postpartum course complicated by anemia which was stable on day 2.  On day of d/c pt meeting all goal for d/c and doing well.    Magnesium  Sulfate received:yes BMZ received: Yes (~7hrs post 1st dose of BMX) Rhophylac:N/A MMR:No T-DaP:Given prenatally Flu: No RSV Vaccine received: No Transfusion:No  Immunizations received: There is no immunization history for the selected administration types on file for this patient.  Physical exam  Vitals:   05/21/23 1221 05/21/23 1646 05/21/23 1940 05/22/23 0408  BP: 133/76 134/70 114/69 123/73  Pulse: 75 70 73 71  Resp: 20 18 17 16   Temp: 98.3 F (36.8 C) 97.8 F (36.6 C) 98.1 F (36.7 C) 98.5 F (36.9 C)  TempSrc: Oral Oral Oral Oral  SpO2: 99%  100% 99% 100%  Weight:      Height:       General: alert, cooperative, and no distress Lochia: appropriate Uterine Fundus: firm Incision: Dressing is clean, dry, and intact DVT Evaluation: No evidence of DVT seen on physical exam. Labs: Lab Results  Component Value Date   WBC 10.2 05/22/2023   HGB 8.7 (L) 05/22/2023   HCT 24.2 (L) 05/22/2023   MCV 67.8 (L) 05/22/2023   PLT 114 (L) 05/22/2023      Latest Ref Rng & Units 05/21/2023    4:24 AM  CMP  Creatinine 0.44 - 1.00 mg/dL 4.09    Edinburgh Score:    05/20/2023    8:22 PM  Edinburgh Postnatal Depression Scale Screening Tool  I have been able to laugh and see the funny side of things. 0  I have looked forward with enjoyment to things. 0  I have blamed myself unnecessarily when things went wrong. 2  I have been anxious or worried for no good reason. 2  I have felt scared or panicky for no good reason. 3  Things have been getting on top of me. 2  I have been so unhappy that I have had difficulty sleeping. 2  I have felt sad or miserable. 2  I have been so unhappy that I have been crying. 1  The thought of harming myself has occurred to me. 0  Edinburgh Postnatal Depression Scale Total 14  No data recorded  After visit meds:  Allergies as of 05/22/2023   No Known Allergies      Medication List     STOP taking these medications    acetaminophen  500 MG tablet Commonly known as: TYLENOL    amoxicillin  500 MG capsule Commonly known as: AMOXIL    aspirin 81 MG chewable tablet   clotrimazole  1 % cream Commonly known as: LOTRIMIN    ferrous sulfate 325 (65 FE) MG EC tablet Replaced by: ferrous sulfate 325 (65 FE) MG tablet   meloxicam  15 MG tablet Commonly known as: Mobic    phenazopyridine  200 MG tablet Commonly known as: Pyridium    predniSONE  10 MG (21) Tbpk tablet Commonly known as: STERAPRED UNI-PAK 21 TAB   prenatal multivitamin Tabs tablet       TAKE these medications    ferrous sulfate 325  (65 FE) MG tablet Take 1 tablet (325 mg total) by mouth every other day. Replaces: ferrous sulfate 325 (65 FE) MG EC tablet   ibuprofen  600 MG tablet Commonly known as: ADVIL  Take 1 tablet (600 mg total) by mouth every 6 (six) hours. What changed:  medication strength how much to take when to take this reasons to take this Another medication with the same name was removed. Continue taking this medication, and follow the directions you see here.   oxyCODONE  5 MG immediate release tablet Commonly known as: Oxy IR/ROXICODONE  Take 1-2 tablets (5-10 mg total) by mouth every 4 (four) hours as needed for moderate pain (pain score 4-6).         Discharge home in stable condition Infant Feeding: Breast Infant Disposition:NICU Discharge instruction: per After Visit Summary and Postpartum booklet. Activity: Advance as tolerated. Pelvic rest for 6 weeks.  Diet: routine diet Future Appointments:No future appointments. Follow up Visit:  Follow-up Information     Department, [facility] Follow up in 1 week(s).   Why: For wound re-check Contact information: [address] [place] [address] [phone]                  Please schedule this patient for a In person postpartum visit in 1 week with the following provider: Any provider. Additional Postpartum F/U:Incision check 1 week  High risk pregnancy complicated by: abruption and PPROM Delivery mode:  C-Section, Low Transverse Anticipated Birth Control:  Unsure   05/22/2023 Onnie Bilis, MD

## 2023-05-20 NOTE — Plan of Care (Signed)

## 2023-05-20 NOTE — Transfer of Care (Signed)
 Immediate Anesthesia Transfer of Care Note  Patient: Erin Archer  Procedure(s) Performed: CESAREAN DELIVERY (Abdomen)  Patient Location: PACU  Anesthesia Type:Spinal  Level of Consciousness: awake, alert , and oriented  Airway & Oxygen Therapy: Patient Spontanous Breathing  Post-op Assessment: Report given to RN and Post -op Vital signs reviewed and stable  Post vital signs: Reviewed and stable  Last Vitals:  Vitals Value Taken Time  BP 97/62 05/20/23 0407  Temp 36.9 C 05/20/23 0408  Pulse 72 05/20/23 0410  Resp 14 05/20/23 0410  SpO2 99 % 05/20/23 0410  Vitals shown include unfiled device data.  Last Pain:  Vitals:   05/20/23 0408  TempSrc: Oral  PainSc:          Complications: No notable events documented.

## 2023-05-20 NOTE — Lactation Note (Signed)
 This note was copied from a baby's chart. Lactation Consultation Note  Patient Name: Erin Archer HYQMV'[id number] Date: 05/20/2023 Age:39 hours Reason for consult: Initial assessment;NICU baby;Preterm <34wks;Infant < 5lbs  P4- Infant was born at [redacted]w[redacted]d GA and weighs 1600g. Infant is admitted to the NICU due to prematurity. [name] has breast fed all of her older children for about 6 weeks each. [name] reports that she stops breastfeeding her children when she goes back to work. [name] has already been set up with a hospital DEBP and has started pumping. LC sized [name] at an 18 mm flange, but encouraged her to go up to a 21 mm flange if the 18 feels pinchy. [name] pumped for 15 minutes and collected 0 drops of colostrum. LC reassured [name] that this is normal due to infant's early arrival. [facility] strongly encouraged [name] to continue pumping every 3 hrs (even if nothing is collected), to ensure proper breast stimulation so her milk can come in. [name] does not have insurance and does not have a pump for home use. LC sent over a [facility] referral per request by [name]. LC reviewed the [facility] loaner pump and the paperwork and $30 that goes with it. [name] informed LC that she would "think about it." LC encouraged [name] to let us  know when she decides what she would like to do. [name] denies having any questions or concerns at this time.  LC reviewed the the importance of pumping every 3 hrs (including nighttime), CDC milk storage guidelines, LC services handout and engorgement/breast care. LC encouraged [name] to call for further assistance as needed.  Maternal Data Has patient been taught Hand Expression?: No Does the patient have breastfeeding experience prior to this delivery?: Yes How long did the patient breastfeed?: About 6 weeks with all 3 of her older children. [name] reports that she stops breastfeeding when she starts work again after leave.  Feeding Mother's Current Feeding Choice: Breast Milk and Formula  Lactation Tools  Discussed/Used Tools: Pump;Flanges Flange Size: 18 Breast pump type: Double-Electric Breast Pump;Manual Pump Education: Setup, frequency, and cleaning;Milk Storage Reason for Pumping: Infant is NICU, [redacted]w[redacted]d GA Pumping frequency: 15-20 min every 3 hrs Pumped volume: 0 mL  Interventions Interventions: Breast feeding basics reviewed;Hand pump;DEBP;Education;LC Services brochure;CDC milk storage guidelines;CDC Guidelines for Breast Pump Cleaning  Discharge Discharge Education: Engorgement and breast care;Warning signs for feeding baby Pump:  ([name] concidering WIC loaner) WIC Program: Yes  Consult Status Consult Status: Follow-up Date: 05/21/23 Follow-up type: In-patient    Vernette Goo BS, IBCLC 05/20/2023, 4:02 PM

## 2023-05-20 NOTE — Progress Notes (Addendum)
 Notified by RN that patient saturated her pad with blood.  In to evaluate patient. Patient denies contractions or abdominal pain. States she feels the blood coming out.  Vitals:   05/20/23 0000 05/20/23 0100  BP: 129/77 115/66  Pulse: 92 86  Resp: 18 18  Temp:    SpO2: 98% 98%    Gen: appears well, alert & oriented Abd: soft, gravid, nontender Perineum: soaked with bright red blood, pad completely saturated with bright red blood SSE: vagina evacuated of several large baseball sized clots in addition to bright red blood accumulating in vagina and pouring out of speculum, blood and clots cleared. No active bleeding noted at this time  FHR cat 1   A/P: PPROM with concern for abruption given large amount of bleeding on exam. Recommend urgent cesarean delivery - s/p BMZ x 1 - on magnesium  for neuroprotection - coags ordered - place a second IV - T&C for 2 units pRBCs - pads to be measured for QBL  Cesarean section consent:  The risks, benefits and alternatives of the procedure were reviewed in detail. The risks of surgery were discussed with the patient including but were not limited to:   -- Bleeding with associated risks of blood transfusion or rare risk of hysterectomy -- Infection which may require antibiotics -- Injury to bowel, bladder, ureters or other surrounding organs; injury to the fetus --Thromboembolic phenomenon such as MI, CVA or death and other postoperative/anesthesia complications.    The patient concurred with the proposed plan, giving informed written consent for the procedure.    Marci Setter, MD, FACOG Obstetrician & Gynecologist, [facility] for [facility], [facility]  Addendum: Pads measured for blood loss at 98 ml, however suspect actual blood loss is greater. Unclear if all the clots were actually measured. Suspect more like 200-300 ml.  Marci Setter, MD, FACOG Obstetrician & Gynecologist, [facility] for [facility], [facility]

## 2023-05-20 NOTE — Anesthesia Procedure Notes (Signed)
 Spinal  Patient location during procedure: OR Start time: 05/20/2023 2:41 AM End time: 05/20/2023 2:44 AM Reason for block: surgical anesthesia Staffing Performed: anesthesiologist  Anesthesiologist: Vernadine Golas, MD Performed by: Vernadine Golas, MD Authorized by: Vernadine Golas, MD   Preanesthetic Checklist Completed: patient identified, IV checked, risks and benefits discussed, monitors and equipment checked, pre-op evaluation and timeout performed Spinal Block Patient position: sitting Prep: DuraPrep and site prepped and draped Patient monitoring: heart rate, continuous pulse ox and blood pressure Approach: midline Location: L3-4 Injection technique: single-shot Needle Needle type: Pencan  Needle gauge: 24 G Needle length: 10 cm Assessment Sensory level: T4 Events: CSF return Additional Notes Risks, benefits, and alternative discussed. Patient gave consent to procedure. Prepped and draped in sitting position. Clear CSF obtained after one needle pass. Positive terminal aspiration. No pain or paraesthesias with injection. Patient tolerated procedure well. Vital signs stable. Amador Junes, MD

## 2023-05-21 LAB — CBC
HCT: 20.9 % — ABNORMAL LOW (ref 36.0–46.0)
Hemoglobin: 7.9 g/dL — ABNORMAL LOW (ref 12.0–15.0)
MCH: 24.8 pg — ABNORMAL LOW (ref 26.0–34.0)
MCHC: 37.3 g/dL — ABNORMAL HIGH (ref 30.0–36.0)
MCV: 66.3 fL — ABNORMAL LOW (ref 80.0–100.0)
Platelets: 117 10*3/uL — ABNORMAL LOW (ref 150–400)
RBC: 3.15 MIL/uL — ABNORMAL LOW (ref 3.87–5.11)
RDW: 17.4 % — ABNORMAL HIGH (ref 11.5–15.5)
WBC: 12.8 10*3/uL — ABNORMAL HIGH (ref 4.0–10.5)
nRBC: 0 % (ref 0.0–0.2)

## 2023-05-21 LAB — GC/CHLAMYDIA PROBE AMP (~~LOC~~) NOT AT ARMC
Chlamydia: NEGATIVE
Comment: NEGATIVE
Comment: NORMAL
Neisseria Gonorrhea: NEGATIVE

## 2023-05-21 LAB — CULTURE, BETA STREP (GROUP B ONLY)

## 2023-05-21 LAB — CREATININE, SERUM
Creatinine, Ser: 0.55 mg/dL (ref 0.44–1.00)
GFR, Estimated: >60 mL/min (ref >60)

## 2023-05-21 MED ORDER — SODIUM CHLORIDE 0.9 % IV SOLN
500.0000 mg | Freq: Once | INTRAVENOUS | Status: AC
  Administered 2023-05-21: 500 mg via INTRAVENOUS
  Filled 2023-05-21: qty 25

## 2023-05-21 NOTE — Progress Notes (Signed)
 POSTPARTUM PROGRESS NOTE  Subjective: 38 y.o. [id number] who is POD1 s/p primary cesarean section for placental abruption after PPROM at 30 wks   No complaints, up ad lib, voiding, and tolerating PO. Denies lightheadedness, dizziness, chest pain or shortness of breath.  Objective: Blood pressure 100/61, pulse 70, temperature 98 F (36.7 C), temperature source Oral, resp. rate 17, height 5\' 5"  (1.651 m), weight 69.8 kg, SpO2 100%, unknown if currently breastfeeding.  Physical Exam:  General: alert, cooperative, and appears stated age Abdomen: soft, nontender nondistended Uterine Fundus: firm Lochia: appropriate Incision: clean/dry/intact Lower extremities: No evidence of DVT seen on physical exam.  Recent Labs    05/20/23 0207 05/21/23 0424  HGB 10.5* 7.9*  HCT 28.0* 20.9*    Assessment/Plan: Postpartum: routine care - Contraception: undecided - MOF: breast/formula-- lactation consult for infant in NICU - Rh status: positive - Rubella status: immune - Consults: lactation  Neonatal - Doing well, in NICU - Circumcision: to be discussed  3. Acute on chronic anemia: Hgb this morning 7.9. Patient not significantly symptomatic, will recheck CBC tomorrow. Discussed blood transfusion as needed but she would like to defer for now which is reasonable   LOS: 2 days    Marci Setter, MD, FACOG Obstetrician & Gynecologist, [facility] for [facility], [facility] 05/21/2023, 7:35 AM

## 2023-05-21 NOTE — Progress Notes (Signed)
 CSW received consult due to score 14 on Edinburgh Depression Screen. CSW seen [name] on yesterday (05/20/2022) provided education regarding Baby Blues vs PMADs and provided [name] with resources for mental health follow up.  CSW encouraged [name] to evaluate her mental health throughout the postpartum period with the use of the New Mom Checklist developed by Postpartum Progress as well as the [place] Postnatal Depression Scale and notify a medical professional if symptoms arise.  CSW screening out referral at this time.   Bobbye Burrow, LCSW Clinical Social Worker [facility] Cell#: [phone]

## 2023-05-21 NOTE — Lactation Note (Signed)
 This note was copied from a baby's chart. Lactation Consultation Note  Patient Name: Erin Archer RUEAV'W Date: 05/21/2023 Age:39 hours Reason for consult: Follow-up assessment;NICU baby;Preterm <34wks;Infant < 5lbs  P3- [name] reports that everything has been going well and she has no concerns at this time. [name] was about to go to sleep, so she requested for [facility] to leave and stated that she would call when she is ready to talk. LC encouraged [name] to rest and to call out when she is ready for assistance.  Maternal Data Has patient been taught Hand Expression?: No Does the patient have breastfeeding experience prior to this delivery?: Yes How long did the patient breastfeed?: 6 weeks  Feeding Mother's Current Feeding Choice: Breast Milk and Formula  Lactation Tools Discussed/Used Tools: Pump;Flanges Flange Size: 18 Breast pump type: Double-Electric Breast Pump;Manual Pump Education: Setup, frequency, and cleaning;Milk Storage Reason for Pumping: Infant in the NICU Pumping frequency: 15-20 every 3 hrs  Interventions Interventions: DEBP;Education;LC Services brochure  Discharge Discharge Education: Engorgement and breast care;Warning signs for feeding baby WIC Program: Yes  Consult Status Consult Status: Follow-up Date: 05/22/23 Follow-up type: In-patient    Vernette Goo BS, IBCLC 05/21/2023, 5:32 PM

## 2023-05-22 ENCOUNTER — Ambulatory Visit (HOSPITAL_COMMUNITY): Payer: Self-pay

## 2023-05-22 LAB — SURGICAL PATHOLOGY

## 2023-05-22 LAB — CBC
HCT: 24.2 % — ABNORMAL LOW (ref 36.0–46.0)
Hemoglobin: 8.7 g/dL — ABNORMAL LOW (ref 12.0–15.0)
MCH: 24.4 pg — ABNORMAL LOW (ref 26.0–34.0)
MCHC: 36 g/dL (ref 30.0–36.0)
MCV: 67.8 fL — ABNORMAL LOW (ref 80.0–100.0)
Platelets: 114 10*3/uL — ABNORMAL LOW (ref 150–400)
RBC: 3.57 MIL/uL — ABNORMAL LOW (ref 3.87–5.11)
RDW: 17.4 % — ABNORMAL HIGH (ref 11.5–15.5)
WBC: 10.2 10*3/uL (ref 4.0–10.5)
nRBC: 0 % (ref 0.0–0.2)

## 2023-05-22 MED ORDER — IBUPROFEN 600 MG PO TABS: 600.0000 mg | ORAL_TABLET | Freq: Four times a day (QID) | ORAL | 0 refills | Status: DC

## 2023-05-22 MED ORDER — FERROUS SULFATE 325 (65 FE) MG PO TABS: 325.0000 mg | ORAL_TABLET | ORAL | 3 refills | Status: DC

## 2023-05-22 MED ORDER — OXYCODONE HCL 5 MG PO TABS: 5.0000 mg | ORAL_TABLET | ORAL | 0 refills | Status: DC | PRN

## 2023-05-22 NOTE — Lactation Note (Signed)
 This note was copied from a baby's chart.  NICU Lactation Consultation Note  Patient Name: Erin Archer [id number]'[id number] Date: 05/22/2023 Age:39 hours  Reason for consult: Follow-up assessment; NICU baby; Preterm <34wks; Infant < 5lbs; Other (Comment) (AMA)  SUBJECTIVE  LC in to visit with P4 Mom of baby "Erin Archer" born by C/S at [redacted]w[redacted]d.  Mom discharged this am from [facility] and plans to go home but plans to return later today.  Mom does not have a pump at home, and has not heard from [facility].  LC re-sent WIC referral to [facility] office. Mom is interested in a [facility] loaner and paperwork provided.  RN will call LC when Mom has the $30 cash refundable deposit.  LC assisted Mom to pump while holding baby STS, and provided her with a small pumping top for hands on pumping. Mom expressed 40 ml and will use the maintain mode on the pump from now on.  Engorgement prevention and treatment reviewed.  Mom encouraged to pump more frequently today.  Mom provided with the parts to make a hand pump as she was going home prior to getting the [facility] loaner.  Mom plans to return, but not sure when.  Mom encouraged to ask baby's RN to call [facility] about a [facility] loaner upon her return to NICU.  Paperwork is at the bedside.   OBJECTIVE Infant data: Mother's Current Feeding Choice: Breast Milk and Formula  O2 Device: Room Air O2 Flow Rate (L/min): 4 L/min FiO2 (%): 21 %  Infant feeding assessment No data recorded  Maternal data: [id number] C-Section, Low Transverse Has patient been taught Hand Expression?: No Current breast feeding challenges:: NICU infant Does the patient have breastfeeding experience prior to this delivery?: Yes How long did the patient breastfeed?: 6 weeks Pumping frequency: Mom encouraged to pump every 3 hrs Pumped volume: 20 mL Flange Size: 18  WIC Program: Yes WIC Referral Sent?: Yes What county?: [place] Pump: Manual (offered a WIC loaner)  ASSESSMENT Infant:  Feeding Status:  Scheduled 9-12-3-6 Feeding method: Tube/Gavage (Bolus)  Maternal: Milk volume: Normal  INTERVENTIONS/PLAN Interventions: Interventions: Breast feeding basics reviewed; Skin to skin; Breast massage; Hand express; DEBP; Hand pump; Education Discharge Education: Engorgement and breast care Tools: Pump; Flanges Pump Education: Setup, frequency, and cleaning; Milk Storage  Plan: Consult Status: NICU follow-up NICU Follow-up type: Verify onset of copious milk; Verify absence of engorgement; Verify DEBP issuance   Erin Archer 05/22/2023, 12:19 PM

## 2023-05-22 NOTE — Plan of Care (Signed)
  Problem: Education: Goal: Knowledge of General Education information will improve Description: Including pain rating scale, medication(s)/side effects and non-pharmacologic comfort measures Outcome: Completed/Met   Problem: Health Behavior/Discharge Planning: Goal: Ability to manage health-related needs will improve Outcome: Completed/Met   Problem: Clinical Measurements: Goal: Ability to maintain clinical measurements within normal limits will improve Outcome: Completed/Met Goal: Will remain free from infection Outcome: Completed/Met Goal: Diagnostic test results will improve Outcome: Completed/Met Goal: Respiratory complications will improve Outcome: Completed/Met Goal: Cardiovascular complication will be avoided Outcome: Completed/Met   Problem: Activity: Goal: Risk for activity intolerance will decrease Outcome: Completed/Met   Problem: Nutrition: Goal: Adequate nutrition will be maintained Outcome: Completed/Met   Problem: Coping: Goal: Level of anxiety will decrease Outcome: Completed/Met   Problem: Elimination: Goal: Will not experience complications related to bowel motility Outcome: Completed/Met Goal: Will not experience complications related to urinary retention Outcome: Completed/Met   Problem: Pain Managment: Goal: General experience of comfort will improve and/or be controlled Outcome: Completed/Met   Problem: Safety: Goal: Ability to remain free from injury will improve Outcome: Completed/Met   Problem: Skin Integrity: Goal: Risk for impaired skin integrity will decrease Outcome: Completed/Met   Problem: Education: Goal: Knowledge of the prescribed therapeutic regimen will improve Outcome: Completed/Met Goal: Understanding of sexual limitations or changes related to disease process or condition will improve Outcome: Completed/Met Goal: Individualized Educational Video(s) Outcome: Completed/Met   Problem: Self-Concept: Goal: Communication of  feelings regarding changes in body function or appearance will improve Outcome: Completed/Met   Problem: Skin Integrity: Goal: Demonstration of wound healing without infection will improve Outcome: Completed/Met   Problem: Education: Goal: Knowledge of condition will improve Outcome: Completed/Met Goal: Individualized Educational Video(s) Outcome: Completed/Met Goal: Individualized Newborn Educational Video(s) Outcome: Completed/Met   Problem: Activity: Goal: Will verbalize the importance of balancing activity with adequate rest periods Outcome: Completed/Met Goal: Ability to tolerate increased activity will improve Outcome: Completed/Met   Problem: Coping: Goal: Ability to identify and utilize available resources and services will improve Outcome: Completed/Met   Problem: Life Cycle: Goal: Chance of risk for complications during the postpartum period will decrease Outcome: Completed/Met   Problem: Role Relationship: Goal: Ability to demonstrate positive interaction with newborn will improve Outcome: Completed/Met   Problem: Skin Integrity: Goal: Demonstration of wound healing without infection will improve Outcome: Completed/Met

## 2023-05-23 ENCOUNTER — Ambulatory Visit: Payer: Self-pay | Admitting: Obstetrics and Gynecology

## 2023-05-23 LAB — TYPE AND SCREEN
ABO/RH(D): A POS
Antibody Screen: NEGATIVE
Unit division: 0
Unit division: 0

## 2023-05-23 LAB — BPAM RBC
Blood Product Expiration Date: 202506102359
Blood Product Expiration Date: 202506122359
ISSUE DATE / TIME: 202505131437
Unit Type and Rh: 6200
Unit Type and Rh: 6200

## 2023-05-29 ENCOUNTER — Other Ambulatory Visit: Payer: Self-pay

## 2023-05-29 ENCOUNTER — Ambulatory Visit (INDEPENDENT_AMBULATORY_CARE_PROVIDER_SITE_OTHER): Payer: Self-pay | Admitting: Obstetrics and Gynecology

## 2023-05-29 VITALS — BP 126/67 | HR 77 | Ht 65.0 in | Wt 141.1 lb

## 2023-05-29 DIAGNOSIS — F53 Postpartum depression: Secondary | ICD-10-CM

## 2023-05-29 DIAGNOSIS — Z4889 Encounter for other specified surgical aftercare: Secondary | ICD-10-CM

## 2023-05-29 NOTE — Progress Notes (Signed)
 Pt presents for incision check following C/S on 5/18. She reports having low back pain, Lt arm pain, chest pain, Lt groin pain and pain in lower Lt leg x4 days. Pt denies SOB. She also reports feeling swelling and pain @ Lt side of incision.  Steri strips were removed. Incision was assessed and found to be healing well. Skin edges are well approximated. No redness, swelling or drainage was observed. Dr. Nolon Baxter in to see pt.

## 2023-05-29 NOTE — Progress Notes (Signed)
    Post Cesarean Section Incision Check  Erin Archer is a 38 y.o. African-American [id number] (No LMP recorded.) who is s/p 1LTCS on 05/19/23 at 30 weeks for placental abruption. She was discharged to home on POD#3. Pregnancy complicated by preterm delivery, PPROM, abruption, anemia.  Complains of back pain when FOB massages her at spinal site. Reports abdominal pain on left side as well, has not had bowel movement in 2 days. Denies fever/chills though she does have sweating a lot at night. Reports chest pain when she is breastfeeding, states comes and goes, only happens when she is breastfeeding and "feels like something is sitting right here" (indicates in between breasts) and feels like it could be related to anxiety. No shortness of breath, pain with breathing, difficulty breathing. Denies heartburn. Some bleeding with breast feeding. States she has been under a lot of stress with delivery and newborn in NICU.   Physical Exam:  BP 126/67   Pulse 77   Ht 5\' 5"  (1.651 m)   Wt 141 lb 1.6 oz (64 kg)   BMI 23.48 kg/m  Body mass index is 23.48 kg/m. General appearance: Well nourished, well developed female in no acute distress however tearful with discussion Abdomen: soft, appropriately tender, incision clean, dry, intact with no signs of active bleeding or infection, uterus appropriately tender for 1 week post op Back: spinal injection site well healed with no signs of infection   Assessment: Patient is a 39 y.o. [id number] who is 1 weeks post partum from a primary c-section. She is doing okay. Reviewed symptoms in detail, no s/s endometritis, though will have low threshold to return to MAU given PPROM, reviewed precautions to return to MAU. Incision appears to be healing appropriately.   Re: chest pain, reviewed that I cannot rule out catastrophic issues in office and would need MAU visit for EKG, etc, however she reports only when breastfeeding and she thinks it may be anxiety. Rec she track  symptoms and present promptly to MAU if she has SOB, chest pain occurring more frequently or with new symptoms, she is agreeable and I think this is reasonable.  Patient tearful with discussion, explored the stress she is under and recommended referral to [facility]. Explained that she does not have to speak with them but may derive some benefit from a consult. Denies SI/HI and agreeable to referral.   Plan:  Follow up 1 week   K. Andi Kaufmann, MD, [facility] Attending Center for [facility])

## 2023-06-03 ENCOUNTER — Ambulatory Visit (HOSPITAL_COMMUNITY): Payer: Self-pay

## 2023-06-03 NOTE — Lactation Note (Signed)
 This note was copied from a baby's chart.  NICU Lactation Consultation Note  Patient Name: Boy Chanin Frumkin [id number] Date: 06/03/2023 Age:39 wk.o.  Reason for consult: Weekly NICU follow-up; NICU baby; Preterm <34wks; Other (Comment); Infant < 5lbs (AMA, maternal THC (+) on UDS at prenatal intake)  SUBJECTIVE Visited with family of 33 1/41 weeks old AGA NICU female "[name]"; Ms. Peregrina is a P4 and reported she's done pumping, she had her last pumping session this morning but she won't be doing it anymore. She voiced she's been using her pump at home the last two weeks, she didn't pick up her WIC pump. She said she signed the donor milk consent and will keep baby on donor milk as long as it is allowed before transitioning to formula (see maternal assessment). Let her know that she can still pump while drying her milk "gradually" in case she still wants to use it for baby due to being premature. Big brother and sister present. All questions and concerns answered, lactation services are completed at this time.  OBJECTIVE Infant data: Mother's Current Feeding Choice: Formula  O2 Device: Room Air  Infant feeding assessment IDFTS - Readiness: 2   Maternal data: [id number] C-Section, Low Transverse Risk factor for low/delayed milk supply:: C/S, prematurity, infant separation, infrequent pumping as 06/03/2023  WIC Program: Yes WIC Referral Sent?: Yes What county?: [place] Pump: Manual (offered a WIC loaner)  ASSESSMENT Infant:  Feeding Status: Scheduled 9-12-3-6 Feeding method: Tube/Gavage (Bolus)  Maternal: No S/S of engorgement at this time  INTERVENTIONS/PLAN Interventions: Interventions: Breast feeding basics reviewed; Education  Plan: Consult Status: Complete   Peja Allender Newman Bare 06/03/2023, 6:40 PM

## 2023-06-07 ENCOUNTER — Ambulatory Visit: Payer: Self-pay

## 2023-06-10 ENCOUNTER — Other Ambulatory Visit: Payer: Self-pay

## 2023-06-10 ENCOUNTER — Emergency Department (HOSPITAL_COMMUNITY)
Admission: EM | Admit: 2023-06-10 | Discharge: 2023-06-10 | Disposition: A | Discharge status: Home / Self Care | Payer: MEDICAID | Attending: Emergency Medicine | Admitting: Emergency Medicine

## 2023-06-10 ENCOUNTER — Encounter (HOSPITAL_COMMUNITY): Payer: Self-pay

## 2023-06-10 DIAGNOSIS — M7989 Other specified soft tissue disorders: Secondary | ICD-10-CM | POA: Diagnosis not present

## 2023-06-10 DIAGNOSIS — R2232 Localized swelling, mass and lump, left upper limb: Secondary | ICD-10-CM | POA: Diagnosis not present

## 2023-06-10 LAB — COMPREHENSIVE METABOLIC PANEL WITH GFR
ALT: 14 U/L (ref 0–44)
AST: 16 U/L (ref 15–41)
Albumin: 3.7 g/dL (ref 3.5–5.0)
Alkaline Phosphatase: 60 U/L (ref 38–126)
Anion gap: 11 (ref 5–15)
BUN: 8 mg/dL (ref 6–20)
CO2: 23 mmol/L (ref 22–32)
Calcium: 9.1 mg/dL (ref 8.9–10.3)
Chloride: 105 mmol/L (ref 98–111)
Creatinine, Ser: 0.69 mg/dL (ref 0.44–1.00)
GFR, Estimated: >60 mL/min (ref >60)
Glucose, Bld: 98 mg/dL (ref 70–99)
Potassium: 3.7 mmol/L (ref 3.5–5.1)
Sodium: 139 mmol/L (ref 135–145)
Total Bilirubin: 0.9 mg/dL (ref 0.0–1.2)
Total Protein: 7 g/dL (ref 6.5–8.1)

## 2023-06-10 LAB — CBC
HCT: 34.5 % — ABNORMAL LOW (ref 36.0–46.0)
Hemoglobin: 12.4 g/dL (ref 12.0–15.0)
MCH: 24.1 pg — ABNORMAL LOW (ref 26.0–34.0)
MCHC: 35.9 g/dL (ref 30.0–36.0)
MCV: 67.1 fL — ABNORMAL LOW (ref 80.0–100.0)
Platelets: 160 10*3/uL (ref 150–400)
RBC: 5.14 MIL/uL — ABNORMAL HIGH (ref 3.87–5.11)
RDW: 17.1 % — ABNORMAL HIGH (ref 11.5–15.5)
WBC: 5.8 10*3/uL (ref 4.0–10.5)
nRBC: 0 % (ref 0.0–0.2)

## 2023-06-10 MED ORDER — KETOROLAC TROMETHAMINE 15 MG/ML IJ SOLN
15.0000 mg | Freq: Once | INTRAMUSCULAR | Status: AC
  Administered 2023-06-10: 15 mg via INTRAMUSCULAR
  Filled 2023-06-10: qty 1

## 2023-06-10 NOTE — ED Provider Notes (Signed)
 [place] [facility]ENC[facility] DEPARTMENT AT [facility] Provider Note   CSN: [id number] Arrival date & time: 06/10/23  1942     History  Chief Complaint  Patient presents with   Arm Pain    Erin Archer is a 38 y.o. female past medical history significant for C-section, UTI, thalassemia, thrombocytopenia, and sickle cell trait presents today for 3 days of left arm swelling and tightness.  Patient denies numbness, tingling, weakness, shortness of breath, chest pain, or injury.   Arm Pain       Home Medications Prior to Admission medications   Medication Sig Start Date End Date Taking? Authorizing Provider  ferrous sulfate  325 (65 FE) MG tablet Take 1 tablet (325 mg total) by mouth every other day. 05/22/23   Tresia Fruit, MD  ibuprofen  (ADVIL ) 600 MG tablet Take 1 tablet (600 mg total) by mouth every 6 (six) hours. 05/22/23   Tresia Fruit, MD  oxyCODONE  (OXY IR/ROXICODONE ) 5 MG immediate release tablet Take 1-2 tablets (5-10 mg total) by mouth every 4 (four) hours as needed for moderate pain (pain score 4-6). 05/22/23   Tresia Fruit, MD  Prenatal Vit-Fe Fumarate-FA (MULTIVITAMIN-PRENATAL) 27-0.8 MG TABS tablet Take 1 tablet by mouth daily at 12 noon.    [provider]      Allergies    Patient has no known allergies.    Review of Systems   Review of Systems  Musculoskeletal:  Positive for myalgias.    Physical Exam Updated Vital Signs BP (!) 141/83 (BP Location: Right Arm)   Pulse 76   Temp 97.9 F (36.6 C) (Oral)   Resp 16   Ht 5\' 5"  (1.651 m)   Wt 63.5 kg   SpO2 100%   Breastfeeding No   BMI 23.30 kg/m  Physical Exam Vitals and nursing note reviewed.  Constitutional:      General: She is not in acute distress.    Appearance: Normal appearance. She is well-developed. She is not ill-appearing, toxic-appearing or diaphoretic.  HENT:     Head: Normocephalic and atraumatic.     Right Ear: External ear normal.     Left Ear: External ear  normal.     Nose: Nose normal.     Mouth/Throat:     Mouth: Mucous membranes are moist.  Eyes:     Conjunctiva/sclera: Conjunctivae normal.  Cardiovascular:     Rate and Rhythm: Normal rate and regular rhythm.     Pulses: Normal pulses.  Pulmonary:     Effort: Pulmonary effort is normal. No respiratory distress.     Breath sounds: Normal breath sounds.  Abdominal:     Palpations: Abdomen is soft.  Musculoskeletal:        General: Swelling and tenderness present. No deformity.     Cervical back: Normal range of motion and neck supple.     Right lower leg: No edema.     Left lower leg: No edema.     Comments: Mild swelling to left anterior forearm with tenderness to palpation.  No deformity, erythema, ecchymosis, reduced ROM, or grip weakness noted.  Negative Finkelstein test  Skin:    General: Skin is warm and dry.     Capillary Refill: Capillary refill takes less than 2 seconds.  Neurological:     General: No focal deficit present.     Mental Status: She is alert.     Sensory: No sensory deficit.  Psychiatric:        Mood  and Affect: Mood normal.     ED Results / Procedures / Treatments   Labs (all labs ordered are listed, but only abnormal results are displayed) Labs Reviewed  CBC - Abnormal; Notable for the following components:      Result Value   RBC 5.14 (*)    HCT 34.5 (*)    MCV 67.1 (*)    MCH 24.1 (*)    RDW 17.1 (*)    All other components within normal limits  COMPREHENSIVE METABOLIC PANEL WITH GFR    EKG None  Radiology No results found.  Procedures Procedures    Medications Ordered in ED Medications  ketorolac  (TORADOL ) 15 MG/ML injection 15 mg (has no administration in time range)    ED Course/ Medical Decision Making/ A&P                                 Medical Decision Making Amount and/or Complexity of Data Reviewed Labs: ordered.   This patient presents to the ED for concern of left upper extremity swelling differential  diagnosis includes DVT, de Quervain's tenosynovitis, compartment syndrome, arterial occlusion   Lab Tests:  I Ordered, and personally interpreted labs.  The pertinent results include: CBC and CMP with no notable findings    Medicines ordered and prescription drug management:  I ordered medication including Toradol  for pain    I have reviewed the patients home medicines and have made adjustments as needed   Problem List / ED Course:  Considered for admission or further workup however patient's vital signs, labs and physical exam are reassuring.  Patient sent for outpatient upper extremity DVT ultrasound as it was not available while in ED.  Patient given return precautions.  I feel patient is safe for discharge at this time.          Final Clinical Impression(s) / ED Diagnoses Final diagnoses:  Left arm swelling    Rx / DC Orders ED Discharge Orders          Ordered    UE Venous Duplex       Comments: IMPORTANT PATIENT INSTRUCTIONS: You have been scheduled for an Outpatient Vascular Study at [facility].  If tomorrow is a Saturday, Sunday or holiday, please go to the [facility] Emergency Department Registration Desk at 11 am tomorrow morning and tell them you are there for a vascular study.  If tomorrow is a weekday (Monday-Friday), please go to the [facility] (address [address], [place]) at 8 am and report to the 4th floor registration Zone A.  Inform registration that you are there for a vascular study.   06/10/23 2342              Carie Charity, PA-C 06/10/23 [name]    [name] Mariner, MD 06/11/23 [phone]

## 2023-06-10 NOTE — ED Triage Notes (Signed)
 Pt 3 weeks post op from emergency C section and reports 3 days of L arm swelling and tightness.

## 2023-06-10 NOTE — Discharge Instructions (Addendum)
 Today you were seen for left arm swelling.  You may use Tylenol  Motrin  as needed for pain.  Please follow the instructions for your outpatient ultrasound study.  Please return to the ED if you have sudden shortness of breath, worsening pain, or numbness/tingling in your extremity.  Thank you for letting us  treat you today. After reviewing your labs, I feel you are safe to go home. Please follow up with your PCP in the next several days and provide them with your records from this visit. Return to the Emergency Room if pain becomes severe or symptoms worsen.

## 2023-06-11 ENCOUNTER — Ambulatory Visit (HOSPITAL_COMMUNITY)
Admission: RE | Admit: 2023-06-11 | Discharge: 2023-06-11 | Disposition: A | Discharge status: Home / Self Care | Payer: Self-pay | Source: Ambulatory Visit | Attending: Emergency Medicine | Admitting: Emergency Medicine

## 2023-06-11 DIAGNOSIS — M7989 Other specified soft tissue disorders: Secondary | ICD-10-CM | POA: Diagnosis present

## 2023-06-11 DIAGNOSIS — M79602 Pain in left arm: Secondary | ICD-10-CM | POA: Diagnosis not present

## 2023-06-20 ENCOUNTER — Ambulatory Visit: Payer: Self-pay | Admitting: Clinical

## 2023-06-20 DIAGNOSIS — Z658 Other specified problems related to psychosocial circumstances: Secondary | ICD-10-CM

## 2023-06-20 DIAGNOSIS — F411 Generalized anxiety disorder: Secondary | ICD-10-CM
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
## 2023-06-22 NOTE — Patient Instructions (Signed)
 [facility] for [facility] at [facility] [address] [place], [place] [address] [phone] (main office) [phone] (Robynne Roat's office)  [place] Child Counsellor  (Childcare options, Early childcare development, etc.) www.[place]children.org  [place]  Child Care Facility Search Engine  [email or web address].http://[email or web address]  New Parent Support Groups [email or web address].net [email or web address]

## 2023-06-29 DIAGNOSIS — Z3009 Encounter for other general counseling and advice on contraception: Secondary | ICD-10-CM | POA: Diagnosis not present

## 2023-06-29 DIAGNOSIS — R011 Cardiac murmur, unspecified: Secondary | ICD-10-CM | POA: Diagnosis not present

## 2023-06-29 DIAGNOSIS — R030 Elevated blood-pressure reading, without diagnosis of hypertension: Secondary | ICD-10-CM | POA: Diagnosis not present

## 2023-06-29 DIAGNOSIS — Z3202 Encounter for pregnancy test, result negative: Secondary | ICD-10-CM | POA: Diagnosis not present

## 2023-06-29 DIAGNOSIS — Z87898 Personal history of other specified conditions: Secondary | ICD-10-CM | POA: Diagnosis not present

## 2023-06-29 DIAGNOSIS — F17210 Nicotine dependence, cigarettes, uncomplicated: Secondary | ICD-10-CM | POA: Diagnosis not present

## 2023-06-29 DIAGNOSIS — Z8759 Personal history of other complications of pregnancy, childbirth and the puerperium: Secondary | ICD-10-CM | POA: Diagnosis not present

## 2023-06-29 DIAGNOSIS — D573 Sickle-cell trait: Secondary | ICD-10-CM | POA: Diagnosis not present

## 2023-07-04 ENCOUNTER — Encounter (HOSPITAL_COMMUNITY): Payer: Self-pay | Admitting: Emergency Medicine

## 2023-07-04 ENCOUNTER — Emergency Department (HOSPITAL_COMMUNITY)
Admission: EM | Admit: 2023-07-04 | Discharge: 2023-07-05 | Disposition: A | Discharge status: Home / Self Care | Attending: Emergency Medicine | Admitting: Emergency Medicine

## 2023-07-04 DIAGNOSIS — L02411 Cutaneous abscess of right axilla: Secondary | ICD-10-CM | POA: Insufficient documentation

## 2023-07-04 NOTE — ED Provider Triage Note (Signed)
 Emergency Medicine Provider Triage Evaluation Note  Erin Archer , a 38 y.o. female  was evaluated in triage.  Pt complains of abscess localized to the right axilla.  Has been present for a couple of days.  She states she has had this problem in the past.  No fever or chills.  Not actively draining.  Review of Systems  Positive:  Negative: See above  Physical Exam  BP 105/79 (BP Location: Left Arm)   Pulse 88   Temp 98.1 F (36.7 C) (Oral)   Resp 16   SpO2 99%  Gen:   Awake, no distress   Resp:  Normal effort  MSK:   Moves extremities without difficulty  Other:  Large 8 cm abscess localized to the right axilla.  Medical Decision Making  Medically screening exam initiated at 9:34 PM.  Appropriate orders placed.  Erin Archer was informed that the remainder of the evaluation will be completed by another provider, this initial triage assessment does not replace that evaluation, and the importance of remaining in the ED until their evaluation is complete.     Theotis Peers [place], [place] 07/04/23 2134

## 2023-07-04 NOTE — ED Triage Notes (Signed)
 Abscess under R axillary area- nondraining. Been using hot compresses and ibuprofen  at home. Denies fevers. Hx of these.

## 2023-07-05 ENCOUNTER — Encounter

## 2023-07-05 MED ORDER — LIDOCAINE-EPINEPHRINE (PF) 2 %-1:200000 IJ SOLN
10.0000 mL | Freq: Once | INTRAMUSCULAR | Status: AC
  Administered 2023-07-05: 10 mL
  Filled 2023-07-05: qty 20

## 2023-07-05 MED ORDER — DOXYCYCLINE HYCLATE 100 MG PO CAPS: 100.0000 mg | ORAL_CAPSULE | Freq: Two times a day (BID) | ORAL | 0 refills | Status: AC

## 2023-07-05 MED ORDER — OXYCODONE-ACETAMINOPHEN 5-325 MG PO TABS
1.0000 | ORAL_TABLET | Freq: Once | ORAL | Status: AC
  Administered 2023-07-05: 1 via ORAL
  Filled 2023-07-05: qty 1

## 2023-07-05 NOTE — Discharge Instructions (Signed)
 You were seen in the emergency department today for management of your abscess.  We have drained the area and cleaned it. The area will likely continue to drain over the next 48 hours, you may have some bleeding as well. This is to be expected. I would like you to have the wound checked in 2-3 days. This can be done by any doctor's office, urgent care, or emergency department. This is to make sure the area hasn't closed too soon and is healing appropriately. Try to keep the area as clean and dry as possible. You can also continue to apply warm compresses to the area or soak in warm water  for 15-20 minutes 3-4 times daily. This can help with wound healing and promote drainage.   You can also clean the area regularly with soap and water  which can help ensure it heals appropriately from the inside out. If you can tolerate the discomfort, you can also use a Q-tip or washcloth to clean the area as well. Avoid cleaners such as peroxide and alcohol as these can damage wound tissue and slow healing.  Keep the area covered with a clean gauze dressing or bandage.  Change this dressing if it becomes wet or dirty or at least once daily.  I am placing you on a course of antibiotics. It is important you finish the entire course! You can take ibuprofen  or tylenol  as needed for pain.   After this area has healed fully and is no longer an open wound, you can use an antiseptic chlorhexidine (brand name Hibiclens) soap from the pharmacy 1-2 x per month in the areas where abscesses are most likely to form (armpits, buttocks, groin). This can help to manage the skin bacteria that causes these abscesses to form. This soap can dry your skin out so use it sparingly.  Additionally, given that you have had recurrent abscesses in this region, you may need to see a surgeon to have the area removed to prevent another recurrence.  I have given you a referral with a number to call to schedule appointment for follow-up.  Please call at  your earliest convenience.  Return if development of worsening swelling, pain, fevers, or any new or worsening symptoms.

## 2023-07-05 NOTE — ED Provider Notes (Signed)
 [name] [facility] AT [facility] Provider Note   CSN: [id number] Arrival date & time: 07/04/23  2026     Patient presents with: Abscess   Erin Archer is a 38 y.o. female.   Patient with no pertinent past medical history presents today with complaints of abscess. She states that same is located in her right axilla. Reports that she had an area here that ruptured about a year ago and she had a small bump that remained afterwards since then. Notes that she has had to have this area drained previously. She reports that 1 week ago she shaved the area and since then has had worsening swelling and pain. Denies fevers or chills. The area has not been draining.   The history is provided by the patient. No language interpreter was used.  Abscess      Prior to Admission medications   Medication Sig Start Date End Date Taking? Authorizing Provider  ferrous sulfate  325 (65 FE) MG tablet Take 1 tablet (325 mg total) by mouth every other day. 05/22/23   Eveline Lynwood MATSU, MD  ibuprofen  (ADVIL ) 600 MG tablet Take 1 tablet (600 mg total) by mouth every 6 (six) hours. 05/22/23   Eveline Lynwood MATSU, MD  oxyCODONE  (OXY IR/ROXICODONE ) 5 MG immediate release tablet Take 1-2 tablets (5-10 mg total) by mouth every 4 (four) hours as needed for moderate pain (pain score 4-6). 05/22/23   Eveline Lynwood MATSU, MD  Prenatal Vit-Fe Fumarate-FA (MULTIVITAMIN-PRENATAL) 27-0.8 MG TABS tablet Take 1 tablet by mouth daily at 12 noon.    [provider]    Allergies: Patient has no known allergies.    Review of Systems  All other systems reviewed and are negative.   Updated Vital Signs BP (!) 123/92 (BP Location: Left Arm)   Pulse 79   Temp 98.6 F (37 C)   Resp 18   SpO2 100%   Physical Exam Vitals and nursing note reviewed.  Constitutional:      General: She is not in acute distress.    Appearance: Normal appearance. She is normal weight. She is not ill-appearing,  toxic-appearing or diaphoretic.  HENT:     Head: Normocephalic and atraumatic.  Cardiovascular:     Rate and Rhythm: Normal rate.  Pulmonary:     Effort: Pulmonary effort is normal. No respiratory distress.  Musculoskeletal:        General: Normal range of motion.     Cervical back: Normal range of motion.  Skin:    General: Skin is warm and dry.     Comments: 3 cm x 2 cm area of fluctuance with surrounding induration noted to the right axilla. No drainage.   Neurological:     General: No focal deficit present.     Mental Status: She is alert.  Psychiatric:        Mood and Affect: Mood normal.        Behavior: Behavior normal.     (all labs ordered are listed, but only abnormal results are displayed) Labs Reviewed - No data to display  EKG: None  Radiology: No results found.   .Incision and Drainage  Date/Time: 07/05/2023 5:22 AM  Performed by: Erin Lauraine LABOR, PA-C Authorized by: Danaria Larsen A, PA-C   Consent:    Consent obtained:  Verbal   Consent given by:  Patient   Risks, benefits, and alternatives were discussed: yes     Risks discussed:  Bleeding, incomplete drainage, pain, damage to  other organs and infection   Alternatives discussed:  No treatment, delayed treatment, alternative treatment, observation and referral Universal protocol:    Procedure explained and questions answered to patient or proxy's satisfaction: yes     Patient identity confirmed:  Verbally with patient Location:    Type:  Abscess   Size:  3 cm x 2 cm   Location: right axilla. Pre-procedure details:    Skin preparation:  Povidone-iodine Sedation:    Sedation type:  Anxiolysis Anesthesia:    Anesthesia method:  Local infiltration   Local anesthetic:  Lidocaine  2% WITH epi Procedure type:    Complexity:  Simple Procedure details:    Incision types:  Cruciate   Drainage:  Purulent   Drainage amount:  Copious   Wound treatment:  Wound left open   Packing materials:   None Post-procedure details:    Procedure completion:  Tolerated well, no immediate complications    Medications Ordered in the ED  lidocaine -EPINEPHrine  (XYLOCAINE  W/EPI) 2 %-1:200000 (PF) injection 10 mL (has no administration in time range)  oxyCODONE -acetaminophen  (PERCOCET/ROXICET) 5-325 MG per tablet 1 tablet (has no administration in time range)                                    Medical Decision Making Risk Prescription drug management.   Patient presents today with complaints of right axilla abscess x 1 week.  She is afebrile, nontoxic-appearing, in no acute distress reassuring vital signs.  Physical exam reveals 3 cm x 2 cm area of fluctuance with surrounding induration to the right axilla consistent with abscess.  Discussed incision and drainage versus warm compresses and antibiotics, patient prefers to proceed with incision and drainage.  Procedure was performed per above with copious purulent fluid and symptomatic improvement.  Abscess was not large enough to warrant packing or drain, recommend wound recheck in 2 days.  Will also send for doxycycline. Also, given recurrent abscesses, given referral to general surgery for follow-up. Encouraged home warm soaks and flushing. Evaluation and diagnostic testing in the emergency department does not suggest an emergent condition requiring admission or immediate intervention beyond what has been performed at this time.  Plan for discharge with close PCP follow-up.  Patient is understanding and amenable with plan, educated on red flag symptoms that would prompt immediate return.  Patient discharged in stable condition.  Final diagnoses:  Abscess of axilla, right    ED Discharge Orders          Ordered    doxycycline (VIBRAMYCIN) 100 MG capsule  2 times daily        07/05/23 0529          An After Visit Summary was printed and given to the patient.      Erin Archer 07/05/23 KENETH Midge Golas, MD 07/05/23  [phone]

## 2023-07-25 ENCOUNTER — Telehealth: Payer: Self-pay | Admitting: Clinical

## 2023-07-25 NOTE — Telephone Encounter (Signed)
 Attempt to reschedule; Left HIPPA-compliant message to call back Warren from [employer] for [employer] at [facility] at  [phone] [facility] office).

## 2023-08-10 ENCOUNTER — Telehealth: Payer: Self-pay | Admitting: Clinical

## 2023-08-10 NOTE — Telephone Encounter (Signed)
 Attempt to reschedule; Left HIPPA-compliant message to call back Warren from [employer] for [employer] at [facility] at  [phone] [facility] office).

## 2024-01-31 ENCOUNTER — Ambulatory Visit: Payer: Self-pay

## 2024-01-31 VITALS — BP 121/76 | HR 88 | Ht 65.0 in | Wt 157.2 lb

## 2024-01-31 DIAGNOSIS — Z3200 Encounter for pregnancy test, result unknown: Secondary | ICD-10-CM

## 2024-01-31 DIAGNOSIS — Z3201 Encounter for pregnancy test, result positive: Secondary | ICD-10-CM

## 2024-01-31 DIAGNOSIS — O0930 Supervision of pregnancy with insufficient antenatal care, unspecified trimester: Secondary | ICD-10-CM

## 2024-01-31 LAB — POCT URINE PREGNANCY: Preg Test, Ur: POSITIVE — AB

## 2024-01-31 MED ORDER — PRENATAL 27-0.8 MG PO TABS: 1.0000 | ORAL_TABLET | Freq: Every day | ORAL | 11 refills | Status: AC

## 2024-01-31 NOTE — Progress Notes (Signed)
 Erin Archer here for a UPT. Pt had a positive upt at home. LMP is 09/22/2023.     UPT in office Positive.    Reviewed medications and informed to start a PNV, if not already. Pt to follow up for New OB visit next available. Order placed for pt to be scheduled w [facility] for detailed u/s

## 2024-02-11 ENCOUNTER — Encounter: Payer: Self-pay | Admitting: Obstetrics

## 2024-03-20 ENCOUNTER — Ambulatory Visit

## 2024-03-20 ENCOUNTER — Other Ambulatory Visit
